# Patient Record
Sex: Male | Born: 1965 | Race: White | Hispanic: No | Marital: Married | Smoking: Never smoker
Health system: Southern US, Community
[De-identification: ages and names within clinical notes are randomized; demographics above are authoritative.]

## PROBLEM LIST (undated history)

## (undated) HISTORY — PX: MEDIAL COLLATERAL LIGAMENT AND LATERAL COLLATERAL LIGAMENT REPAIR, KNEE: SHX2017

## (undated) HISTORY — PX: NASAL SEPTUM SURGERY: SHX37

## (undated) HISTORY — PX: ANTERIOR CRUCIATE LIGAMENT REPAIR: SHX115

---

## 2000-03-11 ENCOUNTER — Emergency Department (HOSPITAL_COMMUNITY): Admission: EM | Admit: 2000-03-11 | Discharge: 2000-03-11 | Payer: Self-pay | Admitting: *Deleted

## 2003-09-04 ENCOUNTER — Emergency Department (HOSPITAL_COMMUNITY): Admission: AD | Admit: 2003-09-04 | Discharge: 2003-09-04 | Payer: Self-pay | Admitting: Family Medicine

## 2010-03-28 ENCOUNTER — Encounter: Admission: RE | Admit: 2010-03-28 | Discharge: 2010-03-28 | Payer: Self-pay | Admitting: Family Medicine

## 2013-05-17 ENCOUNTER — Ambulatory Visit: Payer: Managed Care, Other (non HMO) | Admitting: Physician Assistant

## 2013-05-17 VITALS — BP 111/75 | HR 81 | Temp 97.8°F | Resp 18 | Wt 206.0 lb

## 2013-05-17 DIAGNOSIS — J329 Chronic sinusitis, unspecified: Secondary | ICD-10-CM

## 2013-05-17 MED ORDER — IPRATROPIUM BROMIDE 0.03 % NA SOLN: 2.0000 | Freq: Two times a day (BID) | NASAL | Status: DC

## 2013-05-17 MED ORDER — AMOXICILLIN-POT CLAVULANATE 875-125 MG PO TABS: 1.0000 | ORAL_TABLET | Freq: Two times a day (BID) | ORAL | Status: DC

## 2013-05-17 NOTE — Progress Notes (Signed)
  Subjective:    Patient ID: Danny Kirby, male    DOB: [birth year], 47 y.o.   MRN: [id number]  HPI  [name] is here for sinus pressure for the past week that has been getting progressively worse. Patient points to b/l cheeks, jaws, and across foreheard as areas most tender. [name] has a history of grinding his teeth at night but this pain feels different than that pain from grinding his teeth. Pain is not worse with any particular movements or positions.   Denies SOB, cough, sore throat, postnasal drainage, nasal congestion, ear congestion, and ear pain. Denies fever, chills, or myalgias. He states he feels ok except for the pain in his face.  He has been taking ibuprofen to help with symptoms.  Review of Systems As above    Objective:    Filed Vitals:   05/17/13 0919  BP: 111/75  Pulse: 81  Temp: 97.8 F (36.6 C)  TempSrc: Oral  Resp: 18  Weight: 206 lb (93.441 kg)  SpO2: 99%    Physical Exam  Constitutional: Vital signs are normal. He appears well-developed and well-nourished. He is cooperative.  HENT:  Head: Normocephalic and atraumatic.  Right Ear: Tympanic membrane, external ear and ear canal normal.  Left Ear: Tympanic membrane, external ear and ear canal normal.  Nose: No mucosal edema, rhinorrhea or sinus tenderness. Right sinus exhibits no maxillary sinus tenderness and no frontal sinus tenderness. Left sinus exhibits no maxillary sinus tenderness and no frontal sinus tenderness.  Mouth/Throat: Mucous membranes are normal. No oropharyngeal exudate or posterior oropharyngeal erythema.  Cardiovascular: Normal rate, regular rhythm, S1 normal and S2 normal.   Pulmonary/Chest: Effort normal and breath sounds normal.  Lymphadenopathy:    He has no cervical adenopathy.  Neurological: He is alert.  Skin: Skin is warm and dry.          Assessment & Plan:  Sinusitis - Plan: ipratropium (ATROVENT) 0.03 % nasal spray, amoxicillin-clavulanate (AUGMENTIN) 875-125  MG per tablet  Drink plenty of fluid and rest. F/U is not improving significantly in the next 48 hours

## 2013-05-17 NOTE — Progress Notes (Signed)
I have examined this patient along with the student and agree.  

## 2013-05-17 NOTE — Patient Instructions (Addendum)
Drink plenty of fluids and rest. Take OTC mucinex to help thin mucus.

## 2013-12-17 ENCOUNTER — Ambulatory Visit: Payer: Managed Care, Other (non HMO) | Admitting: Family Medicine

## 2013-12-17 VITALS — BP 118/70 | HR 66 | Temp 97.5°F | Resp 18 | Ht 67.5 in | Wt 199.1 lb

## 2013-12-17 DIAGNOSIS — L02219 Cutaneous abscess of trunk, unspecified: Secondary | ICD-10-CM

## 2013-12-17 DIAGNOSIS — M79622 Pain in left upper arm: Secondary | ICD-10-CM

## 2013-12-17 DIAGNOSIS — M79609 Pain in unspecified limb: Secondary | ICD-10-CM

## 2013-12-17 DIAGNOSIS — L03319 Cellulitis of trunk, unspecified: Secondary | ICD-10-CM

## 2013-12-17 MED ORDER — DOXYCYCLINE HYCLATE 100 MG PO CAPS: 100.0000 mg | ORAL_CAPSULE | Freq: Two times a day (BID) | ORAL | Status: DC

## 2013-12-17 NOTE — Patient Instructions (Signed)
Take the antibiotic as prescribed. Apply a warm compress to the area for 15-20 minutes 2-4 times each day. Change the dressing if it becomes saturated, leaks or falls off.  

## 2013-12-17 NOTE — Progress Notes (Signed)
Subjective:    Patient ID: Danny BaumgartenKevin L Haack, male    DOB: 04/19/1966, 48 y.o.   MRN: [id number]  Arm Pain    Chief Complaint  Patient presents with  . Arm Pain    infection under left arm-noticed 3-4 days ago. Painful, & got it to drain some last night. Feels like another area is popping up too.    This chart was scribed for Ethelda ChickKristi M Smith, MD by Andrew Auaven Small, ED Scribe. This patient was seen in room 4 and the patient's care was started at 12:25 PM.  HPI Comments: Danny BaumgartenKevin L Pavek is a 48 y.o. male who presents to the [facility] complaining of 2 painful abscess under left arm onset 3-4 days. Pt reports that the second abscess began to rise 1 day ago. Pt report that he drained the first himself last night by squeezing the abscess in which he accumulated about half tablespoon of pus. Today he reports low grade fever of 99, diaphoresis and chills. Pt reports multiple similar abscesses of R axilla in the past in which he drained himself. He reports about 5 small abscesses in about 3-4 months ago.  Pt denies recent change in deodorant. Previous I&D of R inguinal region two years ago.  No past medical history on file. No Known Allergies Prior to Admission medications   Not on File   History   Social History  . Marital Status: Married    Spouse Name: N/A    Number of Children: N/A  . Years of Education: N/A   Occupational History  . Not on file.   Social History Main Topics  . Smoking status: Never Smoker   . Smokeless tobacco: Not on file  . Alcohol Use: No  . Drug Use: No  . Sexual Activity: Yes   Other Topics Concern  . Not on file   Social History Narrative   Lives at home with wife    Review of Systems  Constitutional: Positive for fever, chills and diaphoresis.  Skin: Positive for wound. Negative for rash.      Objective:   Physical Exam  Nursing note and vitals reviewed. Constitutional: He is oriented to person, place, and time. He appears  well-developed and well-nourished. No distress.  HENT:  Head: Normocephalic and atraumatic.  Eyes: EOM are normal.  Pulmonary/Chest: Effort normal.  Musculoskeletal: Normal range of motion.  Neurological: He is alert and oriented to person, place, and time.  Skin: Skin is warm and dry. He is not diaphoretic.  1 cm carbuncle/ pustule with induration, erythema at axilla L.  2.5 x 1.5  Indurated area with tenderness and mild fluctuants. 3 cm surrounding erythema inferior to the second indurated area.  Psychiatric: He has a normal mood and affect. His behavior is normal.   PROCEDURE NOTE: SEE SEPARATE PROCEDURE NOTE.    Assessment & Plan:  Pain in left axilla  Cellulitis and abscess of trunk - Plan: Wound culture  1. Pain L axilla:  New. Secondary to abscess/cellulitis.  Recommend Motrin or Tylenol PRN. 2.  L axilla abscess/cellulitis:  New.  Wound culture obtained with I&D; s/p I&D. Rx for Doxycycline provided.  Follow-up 48 hours for reevaluation.  RTC sooner for fever > 100.5, increasing pain, redness.  Meds ordered this encounter  Medications  . doxycycline (VIBRAMYCIN) 100 MG capsule    Sig: Take 1 capsule (100 mg total) by mouth 2 (two) times daily.    Dispense:  20 capsule  Refill:  0   I personally performed the services described in this documentation, which was scribed in my presence.  The recorded information has been reviewed and is accurate.  Nilda SimmerKristi Smith, M.D.  [facility]  [place] [address] [place], [place]  [address] [phone] phone [phone] fax

## 2013-12-17 NOTE — Progress Notes (Signed)
VCO.  Locally anesthetized at the site of active drainage with 2.5cc of 2% lidocaine plain.  Unable to lift eschar.  1cm incisions made with 11 blade. Significantly less purulence expressed than anticipated given size of lesion however pt expressed contents from lesion prior to visit.  Wound cavity explored with curved hematates showing communication between 2 areas of induration.  Sample collected for culture. Wound packed with 1/4 inch plain packing and dressed.

## 2013-12-19 ENCOUNTER — Ambulatory Visit (INDEPENDENT_AMBULATORY_CARE_PROVIDER_SITE_OTHER): Payer: Managed Care, Other (non HMO) | Admitting: Physician Assistant

## 2013-12-19 VITALS — BP 134/72 | HR 62 | Temp 98.1°F | Resp 16 | Ht 67.5 in | Wt 201.0 lb

## 2013-12-19 DIAGNOSIS — L03319 Cellulitis of trunk, unspecified: Principal | ICD-10-CM

## 2013-12-19 DIAGNOSIS — L02219 Cutaneous abscess of trunk, unspecified: Secondary | ICD-10-CM

## 2013-12-19 LAB — WOUND CULTURE
GRAM STAIN: NONE SEEN
Gram Stain: NONE SEEN
Gram Stain: NONE SEEN

## 2013-12-19 NOTE — Progress Notes (Signed)
I have examined this patient along with the student and agree.  Return in about 2 days (around 12/21/2013) for wound care, sooner if pain/swelling increase.

## 2013-12-19 NOTE — Patient Instructions (Signed)
Continue the antibiotic as prescribed. Apply a warm compress to the area for 15-20 minutes 2-4 times each day. Change the dressing if it becomes saturated, leaks or falls off.  

## 2013-12-19 NOTE — Progress Notes (Signed)
   Subjective:    Patient ID: Danny BaumgartenKevin L Gutterman, male    DOB: 08/08/1966, 48 y.o.   MRN: [id number]  HPI  48y.o male presents for wound check of L armpit abscess that was incised and drained on 4/26 office visit.  Pt noted a low grade fever for past 2 days.  He also states the area of induration that was not incised at last visit has been increasing in size and tenderness.  Pt still taking doxycycline as prescribed.  Review of Systems  Constitutional: Positive for fever.  HENT: Negative.   Eyes: Negative.   Respiratory: Negative for cough, shortness of breath and wheezing.   Cardiovascular: Negative.   Gastrointestinal: Negative for nausea, vomiting, abdominal pain and diarrhea.  Endocrine: Negative.   Genitourinary: Negative.   Musculoskeletal: Negative.   Skin: Negative.   Allergic/Immunologic: Negative.   Hematological: Negative.        Objective:   Physical Exam  Constitutional: He is oriented to person, place, and time. He appears well-developed and well-nourished. No distress.  BP 134/72  Pulse 62  Temp(Src) 98.1 F (36.7 C) (Oral)  Resp 16  Ht 5' 7.5" (1.715 m)  Wt 201 lb (91.173 kg)  BMI 31.00 kg/m2  SpO2 98%   Neurological: He is alert and oriented to person, place, and time.    Erythema of L axilla and lateral chest has all but resolved from previous visit.  However erythema surrounding surgical incision site extending 2-873mm.  Lesion anterior to incised abscess is noted to be larger than last visit.  Reluctant to incise this lesion due to the lack of fluctuance and presence of induration.  Packing was removed from incision site.  Superficial eschar material removed.  Cavity was irrigated with 4cc of 2% lidocaine plain.  Minimal exudate expressed from cavity.  Explored with blunt scissors.  Scissors were able to be advanced 4cm  through a communicating passage to cavity underneath the 2nd, more anterior lesion.  Substantial amount of exudate was able to be expressed from  incision site with manipulation of indurated lesion.  Repacked with 1/4" plain packing through communicating passage from original incision site.      Assessment & Plan:   1. Cellulitis and abscess of trunk Wound repacked.  Continue doxycycline. Will return on 4/30 for wound check.

## 2013-12-21 ENCOUNTER — Ambulatory Visit (INDEPENDENT_AMBULATORY_CARE_PROVIDER_SITE_OTHER): Payer: Managed Care, Other (non HMO) | Admitting: Physician Assistant

## 2013-12-21 VITALS — BP 132/84 | HR 63 | Temp 98.0°F | Resp 18 | Ht 68.0 in | Wt 198.6 lb

## 2013-12-21 DIAGNOSIS — L03319 Cellulitis of trunk, unspecified: Secondary | ICD-10-CM

## 2013-12-21 DIAGNOSIS — L02219 Cutaneous abscess of trunk, unspecified: Secondary | ICD-10-CM

## 2013-12-21 NOTE — Progress Notes (Signed)
Patient ID: Danny BaumgartenKevin L Kirby MRN: [id number], DOB: 10/19/1965 48 y.o. Date of Encounter: 12/21/2013, 2:02 PM  Chief Complaint: Wound care   See previous note  HPI: 48 y.o. y/o male presents for wound care s/p I&D on 12/17/13 Doing well No issues or complaints Afebrile/ no chills No nausea or vomiting Tolerating doxycycline.  Pain improving.  Daily dressing change Previous note reviewed  No past medical history on file.   Home Meds: Prior to Admission medications   Medication Sig Start Date End Date Taking? Authorizing Provider  doxycycline (VIBRAMYCIN) 100 MG capsule Take 1 capsule (100 mg total) by mouth 2 (two) times daily. 12/17/13  Yes Ethelda ChickKristi M Smith, MD    Allergies: No Known Allergies  ROS: Constitutional: Afebrile, no chills Cardiovascular: negative for chest pain or palpitations Dermatological: Positive for wound. Negative for erythema, pain, or warmth.  GI: No nausea or vomiting   EXAM: Physical Exam: Blood pressure 132/84, pulse 63, temperature 98 F (36.7 C), temperature source Oral, resp. rate 18, height 5\' 8"  (1.727 m), weight 198 lb 9.6 oz (90.084 kg), SpO2 98.00%., Body mass index is 30.2 kg/(m^2). General: Well developed, well nourished, in no acute distress. Nontoxic appearing. Head: Normocephalic, atraumatic, sclera non-icteric.  Neck: Supple. Lungs: Breathing is unlabored. Heart: Normal rate. Skin:  Warm and moist. Dressing and packing in place. No induration, erythema, or tenderness to palpation of wound. There is a medial area of induration and swelling without any overlying erythema or warmth. Neuro: Alert and oriented X 3. Moves all extremities spontaneously. Normal gait.  Psych:  Responds to questions appropriately with a normal affect.       PROCEDURE: Dressing and packing removed. Moderate amount of purulence expressed Wound bed healthy Irrigated with 1% plain lidocaine 5 cc. Repacked with 1/4 plain packing.  Dressing  applied  LAB: Culture: staph aureus  A/P: 48 y.o. y/o male with axillary cellulitis/abscess as above s/p I&D on 12/17/13 Wound care per above Continue doxycycline.  Pain well controlled Daily dressing changes Recheck 48 hours  Signed, Rhoderick MoodyHeather Alliya Marcon, PA-C 12/21/2013 2:02 PM

## 2013-12-23 ENCOUNTER — Ambulatory Visit (INDEPENDENT_AMBULATORY_CARE_PROVIDER_SITE_OTHER): Payer: Managed Care, Other (non HMO) | Admitting: Physician Assistant

## 2013-12-23 VITALS — BP 124/60 | HR 63 | Temp 98.0°F | Resp 16 | Ht 68.0 in | Wt 198.0 lb

## 2013-12-23 DIAGNOSIS — L03112 Cellulitis of left axilla: Secondary | ICD-10-CM

## 2013-12-23 DIAGNOSIS — M79622 Pain in left upper arm: Secondary | ICD-10-CM

## 2013-12-23 DIAGNOSIS — M79609 Pain in unspecified limb: Secondary | ICD-10-CM

## 2013-12-23 DIAGNOSIS — IMO0002 Reserved for concepts with insufficient information to code with codable children: Secondary | ICD-10-CM

## 2013-12-23 NOTE — Progress Notes (Signed)
   Subjective:    Patient ID: Danny BaumgartenKevin L Musselman, male    DOB: 01/18/1966, 48 y.o.   MRN: [id number]  HPI Pt presents to clinic for a wound recheck.  The area is better but still slightly draining.  He is tolerating the abx ok.  Much less painful.  Changing drsg daily.  Review of Systems  Constitutional: Negative for fever and chills.  Skin: Positive for wound.       Objective:   Physical Exam  Vitals reviewed. Constitutional: He appears well-developed and well-nourished.  HENT:  Head: Normocephalic and atraumatic.  Right Ear: External ear normal.  Left Ear: External ear normal.  Pulmonary/Chest: Effort normal.  Skin: Skin is warm and dry.  Drsg and packing removed from wound - no purulence expressed - minimal induration at the medial aspect of the wound and almost no erythema present around the wound.  Slight TTP along indurated area.  Incision is about the width of the depth of the wound.  Psychiatric: He has a normal mood and affect. His behavior is normal. Judgment and thought content normal.       Assessment & Plan:  Pain in left axilla  Cellulitis of axilla, left  No packing needed.  Finish abx.  Continue drsg changes until wound healed over.   Benny LennertSarah Valen Gillison PA-C  [facility] 12/23/2013 2:48 PM

## 2014-11-11 ENCOUNTER — Ambulatory Visit (INDEPENDENT_AMBULATORY_CARE_PROVIDER_SITE_OTHER): Payer: Managed Care, Other (non HMO) | Admitting: Emergency Medicine

## 2014-11-11 VITALS — BP 114/62 | HR 73 | Temp 97.4°F | Resp 20 | Ht 68.0 in | Wt 194.4 lb

## 2014-11-11 DIAGNOSIS — H04302 Unspecified dacryocystitis of left lacrimal passage: Secondary | ICD-10-CM

## 2014-11-11 MED ORDER — POLYMYXIN B-TRIMETHOPRIM 10000-0.1 UNIT/ML-% OP SOLN: 2.0000 [drp] | OPHTHALMIC | Status: DC

## 2014-11-11 NOTE — Progress Notes (Signed)
[facility] [address], [place] [id number] [phone]  Date:  11/11/2014   Name:  Danny Kirby   DOB:  04/10/1966   MRN:  [id number]  PCP:  No PCP Per Patient    Chief Complaint: Eye Pain   History of Present Illness:  Danny BaumgartenKevin L Kirby is a 49 y.o. very pleasant male patient who presents with the following:  No history of injury.  Mowed grass on Thursday. After developed pain and now swelling in the lower lid No visual symptoms. No gluing or exudate No foreign body sensation;  There are no active problems to display for this patient.   History reviewed. No pertinent past medical history.  Past Surgical History  Procedure Laterality Date  . Anterior cruciate ligament repair    . Nasal septum surgery    . Medial collateral ligament and lateral collateral ligament repair, knee      History  Substance Use Topics  . Smoking status: Never Smoker   . Smokeless tobacco: Not on file  . Alcohol Use: No    History reviewed. No pertinent family history.  No Known Allergies  Medication list has been reviewed and updated.  No current outpatient prescriptions on file prior to visit.   No current facility-administered medications on file prior to visit.    Review of Systems:  As per HPI, otherwise negative.    Physical Examination: Filed Vitals:   11/11/14 1022  BP: 114/62  Pulse: 73  Temp: 97.4 F (36.3 C)  Resp: 20   Filed Vitals:   11/11/14 1022  Height: 5\' 8"  (1.727 m)  Weight: 194 lb 6 oz (88.168 kg)   Body mass index is 29.56 kg/(m^2). Ideal Body Weight: Weight in (lb) to have BMI = 25: 164.1   GEN: WDWN, NAD, Non-toxic, Alert & Oriented x 3 HEENT: Atraumatic, Normocephalic.  Ears and Nose: No external deformity. EXTR: No clubbing/cyanosis/edema NEURO: Normal gait.  PSYCH: Normally interactive. Conversant. Not depressed or anxious appearing.  Calm demeanor.  LEFT eye no fb. No injection or hyphema.  Lower lid medially  swollen and erythematous.  No lacrimal duct drainage  Assessment and Plan: Dacryocystitis Trimethoprim  Signed,  Phillips OdorJeffery Akima Slaugh, MD

## 2014-11-11 NOTE — Patient Instructions (Signed)
Dacryocystitis  Dacryocystitis is an infection of the tear sac (lacrimal sac). The lacrimal sac lies between the inner corner of the eyelids and the nose. The glands of the eyelids produce tears. This is to keep the surface of the eye wet and protect it. These tears drain from the surface of the eyes through a duct in each lid (lacrimal ducts), then through the lacrimal sac into the nose. The tears are then swallowed. If the lacrimal sacs become blocked, bacteria begin to buildup. The lacrimal sacs can become infected. Dacryocystitis may be sudden (acute) or long-lasting (chronic). This problem is most common in infants because the tear ducts are not fully developed and clog easily. In that case, infants may have episodes of tearing and infection. However, in most cases, the problem gets better as the infant grows. CAUSES  The cause is often unknown. Known causes can include:  Malformation of the lacrimal sac.  Injury to the eye.  Eye infection.  Injury or inflammation of the nasal passages. SYMPTOMS   Usually only one eye is involved.  Excessive tearing and watering from the involved eye.  Tenderness, redness, and swelling of the lower lid near the nose.  A sore, red, inflamed bump on the inner corner of the lower lid. DIAGNOSIS  A diagnosis is made after an eye exam to see how much blockage is present and if the surface of the eye is also infected. A culture of the fluid from the lacrimal sac may be examined to find if a specific infection is present. TREATMENT  Treatment depends on:   The person's age.  Whether or not the infection is chronic or acute.  The amount of blockage that is present. Additional treatment Sometimes massaging the area (starting from the inside of the eye and gently massaging down toward the nose) will improve the condition, combined with antibiotic eyedrops or ointments. If massaging the area does not work, it may be necessary to probe the ducts and open up  the drainage system. While this is easily done in the office in adults, probing usually has to be done under general anesthesia in infants.  If the blockage cannot be cleared by probing, surgery may be needed under general anesthesia to create a direct opening for tears to flow between the lacrimal sac and the inside of the nose (dacryocystorhinostomy, DCR). HOME CARE INSTRUCTIONS   Use any antibiotic eyedrops, ointment, or pills as directed by the caregiver. Finish all medicines even if the symptoms start to get better.  Massage the lacrimal sac as directed by the caregiver. SEEK IMMEDIATE MEDICAL CARE IF:   There is increased pain, swelling, redness, or drainage from the eye.  Muscle aches, chills, or a general sick feeling develop.  A fever or persistent symptoms develop for more than 2-3 days.  The fever and symptoms suddenly get worse. MAKE SURE YOU:   Understand these instructions.  Will watch your condition.  Will get help right away if you are not doing well or get worse. Document Released: 08/07/2000 Document Revised: 12/25/2013 Document Reviewed: 01/11/2012 Exit[employer] Patient Information 2015 [employer]. This information is not intended to replace advice given to you by your health care provider. Make sure you discuss any questions you have with your health care provider.  

## 2016-06-11 ENCOUNTER — Ambulatory Visit (INDEPENDENT_AMBULATORY_CARE_PROVIDER_SITE_OTHER): Payer: Managed Care, Other (non HMO) | Admitting: Physician Assistant

## 2016-06-11 VITALS — BP 114/70 | HR 63 | Temp 98.1°F | Resp 17 | Ht 68.0 in | Wt 196.0 lb

## 2016-06-11 DIAGNOSIS — L02419 Cutaneous abscess of limb, unspecified: Secondary | ICD-10-CM | POA: Diagnosis not present

## 2016-06-11 MED ORDER — DOXYCYCLINE HYCLATE 100 MG PO CAPS: 100.0000 mg | ORAL_CAPSULE | Freq: Two times a day (BID) | ORAL | 0 refills | Status: AC

## 2016-06-11 NOTE — Progress Notes (Signed)
   Danny GenreKevin L Kirby  MRN: [id number] DOB: 10/30/1965  PCP: No PCP Per Patient  Subjective:  Pt is a [age] yea rold male who presents to clinic for a mass on his right armpit x two days. He notes it "feels full" and is sore. He squeezed it last night and expressed "a little bit of puss". Did not see a white head on it. States it is mostly just red.  Denies fever, chills, nausea, vomiting, diarrhea.   History of abscesses in groin and armpit. Had abscess in his right armpit drained last year. This provided him relief and he would like this to be drained today.   Review of Systems  Constitutional: Negative for chills, diaphoresis, fatigue and fever.  Respiratory: Negative for cough, chest tightness and shortness of breath.   Cardiovascular: Negative for chest pain and palpitations.  Skin: Positive for color change and wound (right armpit). Negative for rash.  Neurological: Negative for weakness, light-headedness, numbness and headaches.    There are no active problems to display for this patient.   No current outpatient prescriptions on file prior to visit.   No current facility-administered medications on file prior to visit.     No Known Allergies  Objective:  BP 114/70 (BP Location: Right Arm, Patient Position: Sitting, Cuff Size: Large)   Pulse 63   Temp 98.1 F (36.7 C) (Oral)   Resp 17   Ht 5\' 8"  (1.727 m)   Wt 196 lb (88.9 kg)   SpO2 100%   BMI 29.80 kg/m   Physical Exam  Constitutional: He is oriented to person, place, and time and well-developed, well-nourished, and in no distress. No distress.  Cardiovascular: Normal rate, regular rhythm and normal heart sounds.   Pulmonary/Chest: Effort normal. No respiratory distress.  Neurological: He is alert and oriented to person, place, and time. GCS score is 15.  Skin: Skin is warm and dry.  5.5x3 cm firm induration of right axilla. Erythema noted in the center of induration. No fluctuance appreciated. No drainage present. TTP.    Psychiatric: Mood, memory, affect and judgment normal.  Vitals reviewed.   Assessment and Plan :  1. Axillary abscess - doxycycline (VIBRAMYCIN) 100 MG capsule; Take 1 capsule (100 mg total) by mouth 2 (two) times daily.  Dispense: 14 capsule; Refill: 0 - Supportive care: Use warm compressed and antibiotics. If no improvement/worsening symptoms, RTC for possible drainage. He understands and agrees with plan    Marco CollieWhitney Keighley Deckman, PA-C  [facility] 06/11/2016 11:10 AM

## 2016-06-11 NOTE — Patient Instructions (Addendum)
Please use warm compress 3-4 times a day on the affected area. Take antibiotics as prescribed. Please return to clinic in 48-72 hours if you are seeing no improvement.    Thank you for coming in today. I hope you feel we met your needs.  Feel free to call [facility] if you have any questions or further requests.  Please consider signing up for MyChart if you do not already have it, as this is a great way to communicate with me.  Best,  Whitney McVey, PA-C  IF you received an x-ray today, you will receive an invoice from [facility]. Please contact [facility] at [phone] with questions or concerns regarding your invoice.   IF you received labwork today, you will receive an invoice from [employer]. Please contact [facility] at [phone] with questions or concerns regarding your invoice.   Our billing staff will not be able to assist you with questions regarding bills from these companies.  You will be contacted with the lab results as soon as they are available. The fastest way to get your results is to activate your My Chart account. Instructions are located on the last page of this paperwork. If you have not heard from [place] regarding the results in 2 weeks, please contact this office.      Abscess An abscess is an infected area that contains a collection of pus and debris.It can occur in almost any part of the body. An abscess is also known as a furuncle or boil. CAUSES  An abscess occurs when tissue gets infected. This can occur from blockage of oil or sweat glands, infection of hair follicles, or a minor injury to the skin. As the body tries to fight the infection, pus collects in the area and creates pressure under the skin. This pressure causes pain. People with weakened immune systems have difficulty fighting infections and get certain abscesses more often.  SYMPTOMS Usually an abscess develops on the skin and becomes a painful mass that is  red, warm, and tender. If the abscess forms under the skin, you may feel a moveable soft area under the skin. Some abscesses break open (rupture) on their own, but most will continue to get worse without care. The infection can spread deeper into the body and eventually into the bloodstream, causing you to feel ill.  DIAGNOSIS  Your caregiver will take your medical history and perform a physical exam. A sample of fluid may also be taken from the abscess to determine what is causing your infection. TREATMENT  Your caregiver may prescribe antibiotic medicines to fight the infection. However, taking antibiotics alone usually does not cure an abscess. Your caregiver may need to make a small cut (incision) in the abscess to drain the pus. In some cases, gauze is packed into the abscess to reduce pain and to continue draining the area. HOME CARE INSTRUCTIONS   Only take over-the-counter or prescription medicines for pain, discomfort, or fever as directed by your caregiver.  If you were prescribed antibiotics, take them as directed. Finish them even if you start to feel better.  If gauze is used, follow your caregiver's directions for changing the gauze.  To avoid spreading the infection:  Keep your draining abscess covered with a bandage.  Wash your hands well.  Do not share personal care items, towels, or whirlpools with others.  Avoid skin contact with others.  Keep your skin and clothes clean around the abscess.  Keep all follow-up appointments as directed  by your caregiver. SEEK MEDICAL CARE IF:   You have increased pain, swelling, redness, fluid drainage, or bleeding.  You have muscle aches, chills, or a general ill feeling.  You have a fever. MAKE SURE YOU:   Understand these instructions.  Will watch your condition.  Will get help right away if you are not doing well or get worse.   This information is not intended to replace advice given to you by your health care provider.  Make sure you discuss any questions you have with your health care provider.   Document Released: 05/20/2005 Document Revised: 02/09/2012 Document Reviewed: 10/23/2011 Elsevier Interactive Patient Education [employer].

## 2017-08-20 ENCOUNTER — Encounter: Payer: Self-pay | Admitting: Sports Medicine

## 2017-08-20 ENCOUNTER — Ambulatory Visit (INDEPENDENT_AMBULATORY_CARE_PROVIDER_SITE_OTHER): Payer: Managed Care, Other (non HMO) | Admitting: Sports Medicine

## 2017-08-20 ENCOUNTER — Ambulatory Visit (INDEPENDENT_AMBULATORY_CARE_PROVIDER_SITE_OTHER): Payer: Managed Care, Other (non HMO)

## 2017-08-20 VITALS — BP 122/82 | HR 64 | Ht 68.0 in | Wt 208.4 lb

## 2017-08-20 DIAGNOSIS — M542 Cervicalgia: Secondary | ICD-10-CM

## 2017-08-20 MED ORDER — METHYLPREDNISOLONE 4 MG PO TBPK: ORAL_TABLET | ORAL | 0 refills | Status: DC

## 2017-08-20 MED ORDER — GABAPENTIN 400 MG PO CAPS: 400.0000 mg | ORAL_CAPSULE | Freq: Three times a day (TID) | ORAL | 1 refills | Status: DC

## 2017-08-20 NOTE — Patient Instructions (Addendum)
I believe that you do have a herniated/bulging disc in your neck.  Try taking the medications we prescribed.  If you have any issues please call let us know.  Avoid any type of significant straining or heavy lifting.  It is okay to perform gentle aerobic exercise but you should try to avoid riding your bike and any exercises that have you in a slightly bent over position.  We can discuss referral to physical therapy in 2 weeks and see you back

## 2017-08-20 NOTE — Assessment & Plan Note (Signed)
Symptomatic treatment with steroids and gabapentin, follow-up in 2 weeks for consideration of reevaluation and consideration of referral to PT.

## 2017-08-20 NOTE — Progress Notes (Signed)
Veverly FellsMichael D. Delorise Shinerigby, DO  [facility] [phone]  Danny GenreKevin L Kirby - 51 y.o. male MRN [id number]  Date of birth: 09/03/1965  Visit Date: 08/20/2017  PCP: Patient, No Pcp Per   Referred by: No ref. provider found   Scribe for today's visit: Danny Kirby, CMA    SUBJECTIVE:  Danny GenreKevin L Soth is here for New Patient (Initial Visit) (RT arm pain/tingling)  His neck pain and RT arm pain/tingling symptoms INITIALLY: Began a few months ago but tingling in RT arm started Christmas Eve. MOI is unknown.  Described as moderate-severe aching/shooting pain, radiating to the 1st finger and at times 2nd finger.  Worsened with looking up. He has a hard time trying to get comfortable while lying down d/t neck pain.  Improved with sitting still.  Additional associated symptoms include: Pt denies lower back pain. Does noticed pain around RT scapula. He denies recent HA or visual change. Minimal pain when turning head side-to-side.     At this time symptoms are worsening compared to onset, tingling sensation in RT arm is new since onset.  He has been taking Ibuprofen, Acetaminophen, and Aleve with minimal relief. He has tried using heat with short term relief and ice with no relief.    ROS Reports night time disturbances. Denies fevers, chills, or night sweats. Denies unexplained weight loss. Denies personal history of cancer. Denies changes in bowel or bladder habits. Denies recent unreported falls. Denies new or worsening dyspnea or wheezing. Denies headaches or dizziness.  Reports numbness, tingling or weakness  In the extremities.  Denies dizziness or presyncopal episodes Denies lower extremity edema     HISTORY & PERTINENT PRIOR DATA:  Prior History reviewed and updated per electronic medical record.  Significant history, findings, studies and interim changes include:  reports that  has never smoked. he has never used smokeless tobacco. No  results for input(s): HGBA1C, LABURIC, CREATINE in the last 8760 hours. No specialty comments available. Problem  Neck Pain   08/20/2017: X-rays are overall reassuring, suspect C6 radiculitis      OBJECTIVE:  VS:  HT:5\' 8"  (172.7 cm)   WT:208 lb 6.4 oz (94.5 kg)  BMI:31.69    BP:122/82  HR:64bpm  TEMP: ( )  RESP:95 %  PHYSICAL EXAM: Constitutional: WDWN, Non-toxic appearing. Psychiatric: Alert & appropriately interactive. Not depressed or anxious appearing. Respiratory: No increased work of breathing. Trachea Midline Eyes: Pupils are equal. EOM intact without nystagmus. No scleral icterus Cardiovascular:  Peripheral Pulses: peripheral pulses symmetrical No clubbing or cyanosis appreciated Capillary Refill is normal, less than 2 seconds No signficant generalized edema/anasarca Sensory Exam: deficit noted -C6 distribution dysesthesia  SHOULDER Findings: No stiffness, no weakness, no crepitus noted  Neck:   Well aligned, no significant torticollis  Midline Bony TTP: none   Paraspinal Muscle Spasm: No  CERVICAL ROM: normal range of motion only pain with cervical extension with slight limitations.  Remarkably normal side bending and rotation bilaterally  NEURAL TENSION SIGNS Right Left  Brachial Plexus Squeeze: positive, severe pain normal, no pain  Arm Squeeze Test: positive, severe pain normal, no pain  Spurling's Compression Test:    Lhermitte's Compression test: Negative, no radiating pain   REFLEXES Right Left  DTR - C5 -Biceps  trace trace  DTR - C6 - Brachiorad trace trace  DTR - C7 - Triceps trace trace   UMN - Hoffman's Negative/Normal Negative/Normal  UMN - Pectoral     MOTOR TESTING: Intact  in all UE myotomes.  Slight discrepancy with grip strength from right to left this is mild.   ASSESSMENT & PLAN:   1. Neck pain    PLAN: Overall reassuring exam with no focal weakness.  He is diffusely hyporeflexic but symmetric.  Follow-up in 2 weeks to ensure  clinical improvement and if any persistent significant symptoms can consider referral to physical therapy.  If any worsening symptoms concerning for can consider further diagnostic evaluation with MRI but this will hopefully not be needed.  Neck pain Symptomatic treatment with steroids and gabapentin, follow-up in 2 weeks for consideration of reevaluation and consideration of referral to PT.   ++++++++++++++++++++++++++++++++++++++++++++ Orders & Meds: Orders Placed This Encounter  Procedures  . DG Cervical Spine Complete    Meds ordered this encounter  Medications  . methylPREDNISolone (MEDROL DOSEPAK) 4 MG TBPK tablet    Sig: Take by mouth as directed. Take 6 tablets on the first day prescribed then as directed.    Dispense:  21 tablet    Refill:  0  . gabapentin (NEURONTIN) 400 MG capsule    Sig: Take 1 capsule (400 mg total) by mouth 3 (three) times daily.    Dispense:  90 capsule    Refill:  1    ++++++++++++++++++++++++++++++++++++++++++++ Follow-up: Return in about 2 weeks (around 09/03/2017).   Pertinent documentation may be included in additional procedure notes, imaging studies, problem based documentation and patient instructions. Please see these sections of the encounter for additional information regarding this visit. CMA/ATC served as Neurosurgeonscribe during this visit. History, Physical, and Plan performed by medical provider. Documentation and orders reviewed and attested to.      Andrena MewsMichael D Junaid Wurzer, DO    [facility]

## 2017-09-03 ENCOUNTER — Encounter: Payer: Self-pay | Admitting: Sports Medicine

## 2017-09-03 ENCOUNTER — Ambulatory Visit (INDEPENDENT_AMBULATORY_CARE_PROVIDER_SITE_OTHER): Payer: Managed Care, Other (non HMO) | Admitting: Sports Medicine

## 2017-09-03 VITALS — BP 128/86 | HR 61 | Ht 68.0 in | Wt 207.0 lb

## 2017-09-03 DIAGNOSIS — M542 Cervicalgia: Secondary | ICD-10-CM

## 2017-09-03 NOTE — Patient Instructions (Signed)

## 2017-09-03 NOTE — Assessment & Plan Note (Signed)
Fairly significant weakness in the right wrist extensors and pain radiating through C6 distribution.  Marked neural tension signs today and persistent ongoing weakness in spite of conservative measures including gabapentin and Medrol Dosepak.  MRI indicated at this time with need for potential neurosurgical referral versus epidural steroid injection.

## 2017-09-03 NOTE — Progress Notes (Addendum)
Danny Kirby. [facility] at [facility] [phone]  Danny Kirby - 52 y.o. male MRN [id number]  Date of birth: Jun 12, 1966  Visit Date: 09/03/2017  PCP: Patient, No Pcp Per   Referred by: No ref. provider found   Scribe for today's visit: Stevenson Clinch, CMA     SUBJECTIVE:  Danny Kirby is here for Follow-up (neck and RT arm pain)  Compared to the last office visit, his previously described symptoms show no change  Current symptoms are moderate & radiate from neck into RT shoulder, arm, and into finger. Pt reports "it feels like the bone is coming apart, someone is drilling little holes into it". Pt also reports increased weakness in the RT arm, trouble holding razor to shave.  He was prescribed Medrol Dosepak and Gabapentin 400 mg 08/20/17. He reports that he did sleep without disturbance for 3 nights after started Gabapentin but now he is waking up d/t pain again. He has been taking Ibuprofen or Naproxen with minimal relief.   ROS Reports night time disturbances. Denies fevers, chills, or night sweats. Denies unexplained weight loss. Denies personal history of cancer. Denies changes in bowel or bladder habits. Denies recent unreported falls. Denies new or worsening dyspnea or wheezing. Denies headaches or dizziness.  Reports numbness in RT hand and thumb. Also reports weakness in RT arm.  Denies dizziness or presyncopal episodes Denies lower extremity edema     HISTORY & PERTINENT PRIOR DATA:  Prior History reviewed and updated per electronic medical record.  Significant history, findings, studies and interim changes include:  reports that  has never smoked. he has never used smokeless tobacco. No results for input(s): HGBA1C, LABURIC, CREATINE in the last 8760 hours. No specialty comments available. Problem  Neck Pain   08/20/2017: X-rays are overall reassuring, suspect C6 radiculitis     OBJECTIVE:  VS:  HT:5\' 8"   (172.7 cm)   WT:207 lb (93.9 kg)  BMI:31.48    BP:128/86  HR:61bpm  TEMP: ( )  RESP:95 %   PHYSICAL EXAM: Constitutional: WDWN, Non-toxic appearing. Psychiatric: Alert & appropriately interactive. Not depressed or anxious appearing. Respiratory: No increased work of breathing. Trachea Midline Eyes: Pupils are equal. EOM intact without nystagmus. No scleral icterus Cardiovascular:  Peripheral Pulses: peripheral pulses symmetrical No clubbing or cyanosis appreciated Capillary Refill is normal, less than 2 seconds No signficant generalized edema/anasarca Sensory Exam: intact to light touch   SHOULDER Findings: No stiffness, no weakness, no crepitus noted  Neck:   Well aligned, no significant torticollis  Midline Bony TTP: none   Paraspinal Muscle Spasm: No  CERVICAL ROM: Limited side bending to the right.  NEURAL TENSION SIGNS Right Left  Brachial Plexus Squeeze: positive, severe pain normal, no pain  Arm Squeeze Test: positive, severe pain normal, no pain  Spurling's Compression Test: positive, severe pain normal, no pain  Lhermitte's Compression test: Negative, no radiating pain   REFLEXES Right Left  DTR - C5 -Biceps  1+ 1+  DTR - C6 - Brachiorad 1+ 1+  DTR - C7 - Triceps 1+ 1+   UMN - Hoffman's Negative/Normal Negative/Normal  UMN - Pectoral     MOTOR TESTING: Weakness with wrist extension on the right, otherwise upper extremity myotomes intact.  No additional findings.   ASSESSMENT & PLAN:   1. Neck pain    PLAN:    Neck pain Fairly significant weakness in the right wrist extensors and pain radiating through C6 distribution.  Marked neural tension signs today and persistent ongoing weakness in spite of conservative measures including gabapentin and Medrol Dosepak.  MRI indicated at this time with need for potential neurosurgical referral versus epidural steroid injection.    ++++++++++++++++++++++++++++++++++++++++++++ Orders & Meds: Orders Placed This  Encounter  Procedures  . MR Cervical Spine Wo Contrast    No orders of the defined types were placed in this encounter.   ++++++++++++++++++++++++++++++++++++++++++++ Follow-up: No Follow-up on file.   Pertinent documentation may be included in additional procedure notes, imaging studies, problem based documentation and patient instructions. Please see these sections of the encounter for additional information regarding this visit. CMA/ATC served as Neurosurgeonscribe during this visit. History, Physical, and Plan performed by medical provider. Documentation and orders reviewed and attested to.      Andrena MewsMichael D Rigby, DO    [facility]

## 2017-09-06 ENCOUNTER — Ambulatory Visit: Payer: Managed Care, Other (non HMO) | Admitting: Sports Medicine

## 2017-09-14 ENCOUNTER — Telehealth: Payer: Self-pay | Admitting: Sports Medicine

## 2017-09-14 NOTE — Telephone Encounter (Signed)
No further action needed. Facility updated and order sent again.

## 2017-09-14 NOTE — Telephone Encounter (Signed)
Please make changes and advise.   Copied from CRM [id number]. Topic: Referral - Request >> Sep 14, 2017  9:58 AM Oneal GroutSebastian, Jennifer S wrote: Reason for CRM: [facility], does not have drs signature. Stating [facility] is not for their facility. Needs to be changed to [facility] [place], NPI [id number]. Fax # [phone]

## 2017-09-14 NOTE — Telephone Encounter (Signed)
Noted  

## 2017-09-15 ENCOUNTER — Encounter: Payer: Self-pay | Admitting: Sports Medicine

## 2017-09-20 ENCOUNTER — Other Ambulatory Visit: Payer: Self-pay | Admitting: Physical Therapy

## 2017-09-20 ENCOUNTER — Telehealth: Payer: Self-pay | Admitting: Sports Medicine

## 2017-09-20 DIAGNOSIS — M542 Cervicalgia: Secondary | ICD-10-CM

## 2017-09-20 NOTE — Telephone Encounter (Signed)
Called pt and informed him of options of seeing Dr. Alvester MorinNewton or following-up w/ Dr. Berline Choughigby first to then see Dr. Alvester MorinNewton.  Pt asked to be referred to Dr. Alvester MorinNewton and that referral was placed.

## 2017-09-20 NOTE — Telephone Encounter (Signed)
Called pt and left VM to call the office.  

## 2017-09-20 NOTE — Telephone Encounter (Signed)
Per Dr. Berline Choughigby, refer to Dr. Alvester MorinNewton for epidural injection.

## 2017-09-20 NOTE — Telephone Encounter (Signed)
Please advise 

## 2017-09-20 NOTE — Telephone Encounter (Signed)
Please call patient back to advise.

## 2017-09-20 NOTE — Telephone Encounter (Signed)
Copied from CRM [id number]. Topic: Quick Communication - Lab Results >> Sep 20, 2017  1:37 PM Cecelia ByarsGreen, Aeson Sawyers L, RMA wrote: Patient is requesting results from MRI

## 2017-10-06 ENCOUNTER — Ambulatory Visit (INDEPENDENT_AMBULATORY_CARE_PROVIDER_SITE_OTHER): Payer: Managed Care, Other (non HMO) | Admitting: Physical Medicine and Rehabilitation

## 2017-10-06 ENCOUNTER — Telehealth (INDEPENDENT_AMBULATORY_CARE_PROVIDER_SITE_OTHER): Payer: Self-pay | Admitting: Physical Medicine and Rehabilitation

## 2017-10-06 ENCOUNTER — Encounter (INDEPENDENT_AMBULATORY_CARE_PROVIDER_SITE_OTHER): Payer: Self-pay | Admitting: Physical Medicine and Rehabilitation

## 2017-10-06 VITALS — BP 130/81 | HR 68 | Temp 97.8°F

## 2017-10-06 DIAGNOSIS — M5412 Radiculopathy, cervical region: Secondary | ICD-10-CM

## 2017-10-06 DIAGNOSIS — M5010 Cervical disc disorder with radiculopathy, unspecified cervical region: Secondary | ICD-10-CM | POA: Diagnosis not present

## 2017-10-06 DIAGNOSIS — M4802 Spinal stenosis, cervical region: Secondary | ICD-10-CM

## 2017-10-06 MED ORDER — DIAZEPAM 5 MG PO TABS: ORAL_TABLET | ORAL | 0 refills | Status: DC

## 2017-10-06 NOTE — Progress Notes (Deleted)
Pt states an intermittent pain in the neck that radiates through the right arm. Pt states pain has been there since Christmas Eve 2018. Pt states reaching for things makes the pain worse, heating pad makes pain better. Pt [facility]. Right thumb numb. 400mg  gabapentin

## 2017-10-07 NOTE — Progress Notes (Signed)
Danny Kirby - 52 y.o. male MRN [id number]  Date of birth: Jul 09, 1966  Office Visit Note: Visit Date: 10/06/2017 PCP: Patient, No Pcp Per Referred by: Andrena Mews, DO  Subjective: Chief Complaint  Patient presents with  . Neck - Pain  . Right Shoulder - Pain  . Right Arm - Pain, Numbness  . Right Hand - Pain, Numbness    Thumb   HPI: Danny Kirby is a 52 year old right-hand-dominant gentleman who comes in today at the request of Dr. Gaspar Bidding for evaluation and management and possible intervention for right neck shoulder and arm pain with paresthesia.  He reports intermittent pain in the neck but fairly significant pain in the right shoulder that radiates anteriorly and laterally down into the thumb on the right hand.  He reports constant numbness of the right thumb.  He says this started all on Christmas Eve 2018.  He did see Dr. Berline Chough for the first time on December 28.  He reports that after taking gabapentin 400 mg 3 times a day which he does take now that he has been able to get a little bit more sleep but he still wakes up with pain at times.  If he does not note any left-sided symptoms.  He has had no specific trauma or injury.  He said no prior cervical spine surgery.  He has had off-and-on neck pain but nothing like this.  He reports worsening pain with reaching and turning of his head.  He does feel a weakness feeling in the arm and hand.  He does continue to try to lift weights he does exercise.  He does take anti-inflammatories to a degree but does not feel like they help very much.  Pad as well.  He said no associated headaches or bowel or bladder dysfunction.  He is otherwise fairly healthy without any real chronic medical problems.  He did have a course of prednisone which helped a little bit but the symptoms returned.  He did have an issue with neck pain prior to this and did have an MRI performed in 2011.  That MRI was performed by Eula Listen who works at [employer].  We unfortunately do not have their notes to review.  Dr. Berline Chough did obtain a new MRI given the radicular findings and this is reviewed with the patient today and reviewed below the notes.    Review of Systems  Constitutional: Negative for chills, fever, malaise/fatigue and weight loss.  HENT: Negative for hearing loss and sinus pain.   Eyes: Negative for blurred vision, double vision and photophobia.  Respiratory: Negative for cough and shortness of breath.   Cardiovascular: Negative for chest pain, palpitations and leg swelling.  Gastrointestinal: Negative for abdominal pain, nausea and vomiting.  Genitourinary: Negative for flank pain.  Musculoskeletal: Positive for neck pain. Negative for myalgias.       Radicular right arm pain with paresthesias and numbness in the thumb  Skin: Negative for itching and rash.  Neurological: Positive for tingling and weakness. Negative for tremors and focal weakness.  Endo/Heme/Allergies: Negative.   Psychiatric/Behavioral: Negative for depression.  All other systems reviewed and are negative.  Otherwise per HPI.  Assessment & Plan: Visit Diagnoses:  1. Cervical radiculopathy   2. Spinal stenosis of cervical region   3. Cervical disc disorder with radiculopathy     Plan: Findings:  History of prior fall many years ago who where he did land on his posterior neck and had prior  MRI in 2011.  Patient now reports pretty classic C6 radicular paresthesia on the right with MRI findings showing C5-6 central disc protrusion with narrowing of the canal anteriorly touching but not deforming the spinal cord.  There is basically moderate central stenosis at C5-6.  At C6-7 there is also central disc protrusion without as much stenosis.  I feel like there is a small grade 1 listhesis at this level.  This could correspond obviously to his see 6 radicular pain with more of the problem at C5-6.  We discussed at length treatment options.  He is currently on  gabapentin which is a good choice.  I do think it would be warranted to complete diagnostic and hopefully therapeutic C7-T1 interlaminar epidural steroid injection.  Would give him some Valium preprocedure anxiety.  He does not have any signs on physical exam that are worrisome.  We did talk at length about cervical surgery.  Given his age of 52 and the frank numbness in the thumb as well as the moderate spinal stenosis at C5-6 I think at some point he may have to consider decompression and fusion at this level.  Obviously he is at risk for something like a central cord syndrome with minor trauma.  He can continue to exercise but we talked about activity modification with the exercises and lifting.  I have asked him to follow with Dr. Berline Choughigby as well for recommendations in terms of exercise.    Meds & Orders:  Meds ordered this encounter  Medications  . diazepam (VALIUM) 5 MG tablet    Sig: Take 1 by mouth 1 to 2 hours pre-procedure. May repeat if necessary.    Dispense:  2 tablet    Refill:  0   No orders of the defined types were placed in this encounter.   Follow-up: Return for Right C7-T1 interlaminar epidural steroid injection..   Procedures: No procedures performed  No notes on file   Clinical History: MRI CERVICAL SPINE WITHOUT CONTRAST  TECHNIQUE: Multiplanar, multisequence MR imaging of the cervical spine was performed. No intravenous contrast was administered.  COMPARISON: Cervical spine radiographs 08/20/2017  MRI cervical spine 03/28/2010  FINDINGS: Alignment: Physiologic.  Vertebrae: No fracture, evidence of discitis, or bone lesion.  Cord: Normal signal and morphology.  Posterior Fossa, vertebral arteries, paraspinal tissues: Visualized posterior fossa is normal. Vertebral artery flow voids are preserved. No prevertebral soft tissue swelling.  Disc levels:  C1-C2: Normal.  C2-C3: Normal disc space and facets. No spinal canal or neuroforaminal  stenosis.  C3-C4: Small left subarticular disc protrusion without stenosis, unchanged.  C4-C5: Unchanged small central protrusion without stenosis.  C5-C6: Medium-sized left subarticular disc protrusion, unchanged in size, effacing the ventral thecal sac and causing moderate spinal canal stenosis.  C6-C7: Medium-sized central disc protrusion, slightly increased. Mild spinal canal stenosis with indentation of the ventral spinal cord.  C7-T1: Normal disc space and facets. No spinal canal or neuroforaminal stenosis.  IMPRESSION: 1. C5-C6 unchanged left subarticular disc protrusion with moderate spinal canal stenosis and deformity of the spinal cord without signal change. 2. C6-C7 slightly increased size of central disc protrusion with mild spinal canal stenosis.   Electronically Signed By: Deatra RobinsonKevin Herman M.D. On: 09/15/2017 19:50  He reports that  has never smoked. he has never used smokeless tobacco. No results for input(s): HGBA1C, LABURIC in the last 8760 hours.  Objective:  VS:  HT:    WT:   BMI:     BP:130/81  HR:68bpm  TEMP:97.8 F (36.6  C)(Oral)  RESP:98 % Physical Exam  Constitutional: He is oriented to person, place, and time. He appears well-developed and well-nourished. No distress.  HENT:  Head: Normocephalic and atraumatic.  Nose: Nose normal.  Mouth/Throat: Oropharynx is clear and moist.  Eyes: Conjunctivae are normal. Pupils are equal, round, and reactive to light.  Neck: Neck supple. No JVD present. No tracheal deviation present.  Cardiovascular: Regular rhythm and intact distal pulses.  Pulmonary/Chest: Effort normal and breath sounds normal.  Abdominal: Soft. He exhibits no distension. There is no rebound and no guarding.  Musculoskeletal: He exhibits no deformity.  Cervical spine evaluation decent range of motion of the cervical spine with more pain with right radiation and an equivocally positive Spurling's test to the right.  He does have some  tightness in the trapezius muscles bilaterally without focal trigger point.  He does have some increased muscle bulk of the trapezius and deltoids.  No atrophy right compared to left.  He seems to have good range of motion of both shoulders with some bilaterally.  He seems to have full strength bilaterally in all muscles tested.  He may have a little bit of weak numbness with long finger flexion on the right compared to left.  He has diminished reflexes bilaterally at the biceps and brachioradialis.  Feel like that was likely do it is just the fact that I could not get him to relax.  He has a negative Hoffman's test bilaterally.  Neurological: He is alert and oriented to person, place, and time. He exhibits normal muscle tone. Coordination normal.  Skin: Skin is warm. No rash noted.  Psychiatric: He has a normal mood and affect. His behavior is normal.  Nursing note and vitals reviewed.   Ortho Exam Imaging: No results found.  Past Medical/Family/Surgical/Social History: Medications & Allergies reviewed per EMR Patient Active Problem List   Diagnosis Date Noted  . Neck pain 08/20/2017   History reviewed. No pertinent past medical history. History reviewed. No pertinent family history. Past Surgical History:  Procedure Laterality Date  . ANTERIOR CRUCIATE LIGAMENT REPAIR    . MEDIAL COLLATERAL LIGAMENT AND LATERAL COLLATERAL LIGAMENT REPAIR, KNEE    . NASAL SEPTUM SURGERY     Social History   Occupational History  . Not on file  Tobacco Use  . Smoking status: Never Smoker  . Smokeless tobacco: Never Used  Substance and Sexual Activity  . Alcohol use: No    Alcohol/week: 0.0 oz  . Drug use: No  . Sexual activity: Yes

## 2017-10-18 ENCOUNTER — Encounter (INDEPENDENT_AMBULATORY_CARE_PROVIDER_SITE_OTHER): Payer: Self-pay | Admitting: Physical Medicine and Rehabilitation

## 2017-10-18 ENCOUNTER — Ambulatory Visit (INDEPENDENT_AMBULATORY_CARE_PROVIDER_SITE_OTHER): Payer: Managed Care, Other (non HMO)

## 2017-10-18 ENCOUNTER — Ambulatory Visit (INDEPENDENT_AMBULATORY_CARE_PROVIDER_SITE_OTHER): Payer: Managed Care, Other (non HMO) | Admitting: Physical Medicine and Rehabilitation

## 2017-10-18 VITALS — BP 122/78 | HR 61 | Temp 97.8°F

## 2017-10-18 DIAGNOSIS — M5010 Cervical disc disorder with radiculopathy, unspecified cervical region: Secondary | ICD-10-CM | POA: Diagnosis not present

## 2017-10-18 DIAGNOSIS — M5412 Radiculopathy, cervical region: Secondary | ICD-10-CM

## 2017-10-18 DIAGNOSIS — M4802 Spinal stenosis, cervical region: Secondary | ICD-10-CM

## 2017-10-18 MED ORDER — METHYLPREDNISOLONE ACETATE 80 MG/ML IJ SUSP
80.0000 mg | Freq: Once | INTRAMUSCULAR | Status: AC
  Administered 2017-10-18: 80 mg

## 2017-10-18 NOTE — Patient Instructions (Signed)

## 2017-10-18 NOTE — Progress Notes (Deleted)
Pt states pain in neck that radiates through the right arm with some tingling in his right thumb. No changes since last visit. +Driver, -BT, -Dye Allergies.

## 2017-10-18 NOTE — Progress Notes (Signed)
Danny GenreKevin L Kirby - 52 y.o. male MRN [id number]  Date of birth: 09/05/1965  Office Visit Note: Visit Date: 10/18/2017 PCP: Patient, No Pcp Per Referred by: Andrena Mewsigby, Michael D, DO  Subjective: Chief Complaint  Patient presents with  . Neck - Pain  . Right Arm - Pain   HPI: Danny Kirby is a 52 year old right-hand-dominant gentleman who comes in today for planned right C7-T1 interlaminar epidural steroid injection please see our prior evaluation and management note for further details and justification.    ROS Otherwise per HPI.  Assessment & Plan: Visit Diagnoses:  1. Cervical radiculopathy   2. Spinal stenosis of cervical region   3. Cervical disc disorder with radiculopathy     Plan: Findings:  Right C7-T1 interlaminar epidural steroid injection.    Meds & Orders:  Meds ordered this encounter  Medications  . methylPREDNISolone acetate (DEPO-MEDROL) injection 80 mg    Orders Placed This Encounter  Procedures  . XR C-ARM NO REPORT  . Epidural Steroid injection    Follow-up: Return if symptoms worsen or fail to improve, for Dr. Berline Choughigby.   Procedures: No procedures performed  Cervical Epidural Steroid Injection - Interlaminar Approach with Fluoroscopic Guidance  Patient: Danny Kirby      Date of Birth: 52/11/1965 MRN: [id number] PCP: Patient, No Pcp Per      Visit Date: 10/18/2017   Universal Protocol:    Date/Time: 10/18/1909:41 AM  Consent Given By: the patient  Position: PRONE  Additional Comments: Vital signs were monitored before and after the procedure. Patient was prepped and draped in the usual sterile fashion. The correct patient, procedure, and site was verified.   Injection Procedure Details:  Procedure Site One Meds Administered:  Meds ordered this encounter  Medications  . methylPREDNISolone acetate (DEPO-MEDROL) injection 80 mg     Laterality: Right  Location/Site: C7-T1  Needle size: 20 G  Needle type: Touhy  Needle Placement:  Paramedian epidural space  Findings:  -Comments: Excellent flow of contrast into the epidural space.  Procedure Details: Using a paramedian approach from the side mentioned above, the region overlying the inferior lamina was localized under fluoroscopic visualization and the soft tissues overlying this structure were infiltrated with 4 ml. of 1% Lidocaine without Epinephrine. A # 20 gauge, Tuohy needle was inserted into the epidural space using a paramedian approach.  The epidural space was localized using loss of resistance along with lateral and contralateral oblique bi-planar fluoroscopic views.  After negative aspirate for air, blood, and CSF, a 2 ml. volume of Isovue-250 was injected into the epidural space and the flow of contrast was observed. Radiographs were obtained for documentation purposes.   The injectate was administered into the level noted above.  Additional Comments:  The patient tolerated the procedure well Dressing: Band-Aid    Post-procedure details: Patient was observed during the procedure. Post-procedure instructions were reviewed.  Patient left the clinic in stable condition.   Clinical History: MRI CERVICAL SPINE WITHOUT CONTRAST  TECHNIQUE: Multiplanar, multisequence MR imaging of the cervical spine was performed. No intravenous contrast was administered.  COMPARISON: Cervical spine radiographs 08/20/2017  MRI cervical spine 03/28/2010  FINDINGS: Alignment: Physiologic.  Vertebrae: No fracture, evidence of discitis, or bone lesion.  Cord: Normal signal and morphology.  Posterior Fossa, vertebral arteries, paraspinal tissues: Visualized posterior fossa is normal. Vertebral artery flow voids are preserved. No prevertebral soft tissue swelling.  Disc levels:  C1-C2: Normal.  C2-C3: Normal disc space and facets. No spinal canal or neuroforaminal  stenosis.  C3-C4: Small left subarticular disc protrusion without stenosis, unchanged.  C4-C5:  Unchanged small central protrusion without stenosis.  C5-C6: Medium-sized left subarticular disc protrusion, unchanged in size, effacing the ventral thecal sac and causing moderate spinal canal stenosis.  C6-C7: Medium-sized central disc protrusion, slightly increased. Mild spinal canal stenosis with indentation of the ventral spinal cord.  C7-T1: Normal disc space and facets. No spinal canal or neuroforaminal stenosis.  IMPRESSION: 1. C5-C6 unchanged left subarticular disc protrusion with moderate spinal canal stenosis and deformity of the spinal cord without signal change. 2. C6-C7 slightly increased size of central disc protrusion with mild spinal canal stenosis.   Electronically Signed By: Deatra Robinson M.D. On: 09/15/2017 19:50  He reports that  has never smoked. he has never used smokeless tobacco. No results for input(s): HGBA1C, LABURIC in the last 8760 hours.  Objective:  VS:  HT:    WT:   BMI:     BP:122/78  HR:61bpm  TEMP:97.8 F (36.6 C)(Oral)  RESP:98 % Physical Exam  Ortho Exam Imaging: Xr C-arm No Report  Result Date: 10/18/2017 Please see Notes or Procedures tab for imaging impression.   Past Medical/Family/Surgical/Social History: Medications & Allergies reviewed per EMR Patient Active Problem List   Diagnosis Date Noted  . Neck pain 08/20/2017   History reviewed. No pertinent past medical history. History reviewed. No pertinent family history. Past Surgical History:  Procedure Laterality Date  . ANTERIOR CRUCIATE LIGAMENT REPAIR    . MEDIAL COLLATERAL LIGAMENT AND LATERAL COLLATERAL LIGAMENT REPAIR, KNEE    . NASAL SEPTUM SURGERY     Social History   Occupational History  . Not on file  Tobacco Use  . Smoking status: Never Smoker  . Smokeless tobacco: Never Used  Substance and Sexual Activity  . Alcohol use: No    Alcohol/week: 0.0 oz  . Drug use: No  . Sexual activity: Yes

## 2017-10-18 NOTE — Procedures (Signed)
Cervical Epidural Steroid Injection - Interlaminar Approach with Fluoroscopic Guidance  Patient: Danny Kirby      Date of Birth: 12/30/1965 MRN: [id number] PCP: Patient, No Pcp Per      Visit Date: 10/18/2017   Universal Protocol:    Date/Time: 10/18/1909:41 AM  Consent Given By: the patient  Position: PRONE  Additional Comments: Vital signs were monitored before and after the procedure. Patient was prepped and draped in the usual sterile fashion. The correct patient, procedure, and site was verified.   Injection Procedure Details:  Procedure Site One Meds Administered:  Meds ordered this encounter  Medications  . methylPREDNISolone acetate (DEPO-MEDROL) injection 80 mg     Laterality: Right  Location/Site: C7-T1  Needle size: 20 G  Needle type: Touhy  Needle Placement: Paramedian epidural space  Findings:  -Comments: Excellent flow of contrast into the epidural space.  Procedure Details: Using a paramedian approach from the side mentioned above, the region overlying the inferior lamina was localized under fluoroscopic visualization and the soft tissues overlying this structure were infiltrated with 4 ml. of 1% Lidocaine without Epinephrine. A # 20 gauge, Tuohy needle was inserted into the epidural space using a paramedian approach.  The epidural space was localized using loss of resistance along with lateral and contralateral oblique bi-planar fluoroscopic views.  After negative aspirate for air, blood, and CSF, a 2 ml. volume of Isovue-250 was injected into the epidural space and the flow of contrast was observed. Radiographs were obtained for documentation purposes.   The injectate was administered into the level noted above.  Additional Comments:  The patient tolerated the procedure well Dressing: Band-Aid    Post-procedure details: Patient was observed during the procedure. Post-procedure instructions were reviewed.  Patient left the clinic in stable  condition.

## 2017-11-08 ENCOUNTER — Emergency Department (HOSPITAL_COMMUNITY)
Admission: EM | Admit: 2017-11-08 | Discharge: 2017-11-08 | Discharge status: Absent without leave | Payer: Managed Care, Other (non HMO)

## 2017-11-09 ENCOUNTER — Inpatient Hospital Stay (HOSPITAL_COMMUNITY)
Admission: EM | Admit: 2017-11-09 | Discharge: 2017-11-11 | DRG: 603 | Disposition: A | Discharge status: Home / Self Care | Payer: Managed Care, Other (non HMO) | Attending: Family Medicine | Admitting: Family Medicine

## 2017-11-09 ENCOUNTER — Other Ambulatory Visit: Payer: Self-pay

## 2017-11-09 ENCOUNTER — Encounter (HOSPITAL_COMMUNITY): Payer: Self-pay

## 2017-11-09 ENCOUNTER — Emergency Department (HOSPITAL_COMMUNITY): Payer: Managed Care, Other (non HMO)

## 2017-11-09 DIAGNOSIS — M7989 Other specified soft tissue disorders: Secondary | ICD-10-CM | POA: Diagnosis not present

## 2017-11-09 DIAGNOSIS — L03116 Cellulitis of left lower limb: Secondary | ICD-10-CM | POA: Diagnosis not present

## 2017-11-09 DIAGNOSIS — M4802 Spinal stenosis, cervical region: Secondary | ICD-10-CM | POA: Diagnosis present

## 2017-11-09 DIAGNOSIS — L03119 Cellulitis of unspecified part of limb: Secondary | ICD-10-CM

## 2017-11-09 DIAGNOSIS — Z79899 Other long term (current) drug therapy: Secondary | ICD-10-CM

## 2017-11-09 DIAGNOSIS — M5412 Radiculopathy, cervical region: Secondary | ICD-10-CM | POA: Diagnosis present

## 2017-11-09 DIAGNOSIS — L0390 Cellulitis, unspecified: Secondary | ICD-10-CM | POA: Diagnosis present

## 2017-11-09 LAB — CBC WITH DIFFERENTIAL/PLATELET
BASOS ABS: 0 10*3/uL (ref 0.0–0.1)
Basophils Relative: 0 %
EOS ABS: 0.3 10*3/uL (ref 0.0–0.7)
Eosinophils Relative: 3 %
HCT: 41.4 % (ref 39.0–52.0)
Hemoglobin: 13.3 g/dL (ref 13.0–17.0)
LYMPHS PCT: 23 %
Lymphs Abs: 2.2 10*3/uL (ref 0.7–4.0)
MCH: 29 pg (ref 26.0–34.0)
MCHC: 32.1 g/dL (ref 30.0–36.0)
MCV: 90.4 fL (ref 78.0–100.0)
MONO ABS: 0.8 10*3/uL (ref 0.1–1.0)
Monocytes Relative: 8 %
Neutro Abs: 6.7 10*3/uL (ref 1.7–7.7)
Neutrophils Relative %: 66 %
PLATELETS: 258 10*3/uL (ref 150–400)
RBC: 4.58 MIL/uL (ref 4.22–5.81)
RDW: 12.9 % (ref 11.5–15.5)
WBC: 9.9 10*3/uL (ref 4.0–10.5)

## 2017-11-09 LAB — BASIC METABOLIC PANEL
ANION GAP: 12 (ref 5–15)
BUN: 19 mg/dL (ref 6–20)
CALCIUM: 9 mg/dL (ref 8.9–10.3)
CO2: 24 mmol/L (ref 22–32)
Chloride: 106 mmol/L (ref 101–111)
Creatinine, Ser: 1.06 mg/dL (ref 0.61–1.24)
GFR calc Af Amer: >60 mL/min (ref >60)
GLUCOSE: 102 mg/dL — AB (ref 65–99)
Potassium: 3.8 mmol/L (ref 3.5–5.1)
SODIUM: 142 mmol/L (ref 135–145)

## 2017-11-09 LAB — I-STAT CG4 LACTIC ACID, ED: LACTIC ACID, VENOUS: 1.29 mmol/L (ref 0.5–1.9)

## 2017-11-09 MED ORDER — SODIUM CHLORIDE 0.9 % IV SOLN
INTRAVENOUS | Status: DC
  Administered 2017-11-09: 15:00:00 via INTRAVENOUS

## 2017-11-09 MED ORDER — ONDANSETRON HCL 4 MG/2ML IJ SOLN: 4.0000 mg | Freq: Four times a day (QID) | INTRAMUSCULAR | Status: DC | PRN

## 2017-11-09 MED ORDER — IOPAMIDOL (ISOVUE-300) INJECTION 61%
100.0000 mL | Freq: Once | INTRAVENOUS | Status: AC | PRN
  Administered 2017-11-09: 100 mL via INTRAVENOUS

## 2017-11-09 MED ORDER — IOPAMIDOL (ISOVUE-300) INJECTION 61%
INTRAVENOUS | Status: AC
  Filled 2017-11-09: qty 100

## 2017-11-09 MED ORDER — PIPERACILLIN-TAZOBACTAM 3.375 G IVPB 30 MIN
3.3750 g | Freq: Once | INTRAVENOUS | Status: AC
  Administered 2017-11-09: 3.375 g via INTRAVENOUS
  Filled 2017-11-09: qty 50

## 2017-11-09 MED ORDER — TRAMADOL HCL 50 MG PO TABS
50.0000 mg | ORAL_TABLET | Freq: Four times a day (QID) | ORAL | Status: DC | PRN
  Administered 2017-11-09 – 2017-11-11 (×4): 50 mg via ORAL
  Filled 2017-11-09 (×4): qty 1

## 2017-11-09 MED ORDER — ACETAMINOPHEN 325 MG PO TABS
650.0000 mg | ORAL_TABLET | Freq: Four times a day (QID) | ORAL | Status: DC | PRN
  Administered 2017-11-10: 650 mg via ORAL
  Filled 2017-11-09: qty 2

## 2017-11-09 MED ORDER — SODIUM CHLORIDE 0.9 % IJ SOLN
INTRAMUSCULAR | Status: AC
  Filled 2017-11-09: qty 50

## 2017-11-09 MED ORDER — GABAPENTIN 400 MG PO CAPS
400.0000 mg | ORAL_CAPSULE | Freq: Three times a day (TID) | ORAL | Status: DC
  Administered 2017-11-09 – 2017-11-11 (×5): 400 mg via ORAL
  Filled 2017-11-09 (×5): qty 1

## 2017-11-09 MED ORDER — ACETAMINOPHEN 650 MG RE SUPP: 650.0000 mg | Freq: Four times a day (QID) | RECTAL | Status: DC | PRN

## 2017-11-09 MED ORDER — PIPERACILLIN-TAZOBACTAM 3.375 G IVPB
3.3750 g | Freq: Three times a day (TID) | INTRAVENOUS | Status: DC
  Administered 2017-11-10: 3.375 g via INTRAVENOUS
  Filled 2017-11-09 (×2): qty 50

## 2017-11-09 MED ORDER — SODIUM CHLORIDE 0.9 % IV SOLN: INTRAVENOUS | Status: DC

## 2017-11-09 MED ORDER — VANCOMYCIN HCL 10 G IV SOLR
1750.0000 mg | INTRAVENOUS | Status: DC
  Administered 2017-11-10 – 2017-11-11 (×2): 1750 mg via INTRAVENOUS
  Filled 2017-11-09 (×2): qty 1750

## 2017-11-09 MED ORDER — PIPERACILLIN-TAZOBACTAM 3.375 G IVPB 30 MIN: 3.3750 g | Freq: Once | INTRAVENOUS | Status: DC

## 2017-11-09 MED ORDER — VANCOMYCIN HCL IN DEXTROSE 1-5 GM/200ML-% IV SOLN
1000.0000 mg | Freq: Once | INTRAVENOUS | Status: AC
Start: 2017-11-09 — End: 2017-11-09
  Administered 2017-11-09: 1000 mg via INTRAVENOUS
  Filled 2017-11-09: qty 200

## 2017-11-09 MED ORDER — ENOXAPARIN SODIUM 40 MG/0.4ML ~~LOC~~ SOLN
40.0000 mg | SUBCUTANEOUS | Status: DC
  Administered 2017-11-09 – 2017-11-10 (×2): 40 mg via SUBCUTANEOUS
  Filled 2017-11-09 (×2): qty 0.4

## 2017-11-09 MED ORDER — ONDANSETRON HCL 4 MG PO TABS: 4.0000 mg | ORAL_TABLET | Freq: Four times a day (QID) | ORAL | Status: DC | PRN

## 2017-11-09 NOTE — ED Notes (Signed)
ED TO INPATIENT HANDOFF REPORT  Name/Age/Gender Danny Kirby 52 y.o. male  Code Status Code Status History    This patient does not have a recorded code status. Please follow your organizational policy for patients in this situation.      Home/SNF/Other Home  Chief Complaint leg infection  Level of Care/Admitting Diagnosis ED Disposition    ED Disposition Condition Comment   Admit  Hospital Area: [facility] [[id number]]  Level of Care: Med-Surg [16]  Diagnosis: Cellulitis [644034]  Admitting Physician: Hosie Poisson [[id number]]  Attending Physician: Hosie Poisson [[id number]]  PT Class (Do Not Modify): Observation [104]  PT Acc Code (Do Not Modify): Observation [10022]       Medical History History reviewed. No pertinent past medical history.  Allergies No Known Allergies  IV Location/Drains/Wounds Patient Lines/Drains/Airways Status   Active Line/Drains/Airways    None          Labs/Imaging Results for orders placed or performed during the hospital encounter of 11/09/17 (from the past 48 hour(s))  CBC with Differential/Platelet     Status: None   Collection Time: 11/09/17  1:37 PM  Result Value Ref Range   WBC 9.9 4.0 - 10.5 K/uL   RBC 4.58 4.22 - 5.81 MIL/uL   Hemoglobin 13.3 13.0 - 17.0 g/dL   HCT 41.4 39.0 - 52.0 %   MCV 90.4 78.0 - 100.0 fL   MCH 29.0 26.0 - 34.0 pg   MCHC 32.1 30.0 - 36.0 g/dL   RDW 12.9 11.5 - 15.5 %   Platelets 258 150 - 400 K/uL   Neutrophils Relative % 66 %   Neutro Abs 6.7 1.7 - 7.7 K/uL   Lymphocytes Relative 23 %   Lymphs Abs 2.2 0.7 - 4.0 K/uL   Monocytes Relative 8 %   Monocytes Absolute 0.8 0.1 - 1.0 K/uL   Eosinophils Relative 3 %   Eosinophils Absolute 0.3 0.0 - 0.7 K/uL   Basophils Relative 0 %   Basophils Absolute 0.0 0.0 - 0.1 K/uL    Comment: Performed at [facility], [place] [address], [place], [place] [address]  Basic metabolic panel     Status: Abnormal   Collection Time:  11/09/17  1:37 PM  Result Value Ref Range   Sodium 142 135 - 145 mmol/L   Potassium 3.8 3.5 - 5.1 mmol/L   Chloride 106 101 - 111 mmol/L   CO2 24 22 - 32 mmol/L   Glucose, Bld 102 (H) 65 - 99 mg/dL   BUN 19 6 - 20 mg/dL   Creatinine, Ser 1.06 0.61 - 1.24 mg/dL   Calcium 9.0 8.9 - 10.3 mg/dL   GFR calc non Af Amer >60 >60 mL/min   GFR calc Af Amer >60 >60 mL/min    Comment: (NOTE) The eGFR has been calculated using the CKD EPI equation. This calculation has not been validated in all clinical situations. eGFR's persistently <60 mL/min signify possible Chronic Kidney Disease.    Anion gap 12 5 - 15    Comment: Performed at [facility], [place] [address], [place], [place] [address]  I-Stat CG4 Lactic Acid, ED     Status: None   Collection Time: 11/09/17  2:18 PM  Result Value Ref Range   Lactic Acid, Venous 1.29 0.5 - 1.9 mmol/L   Ct Extremity Lower Left W Contrast  Result Date: 11/09/2017 CLINICAL DATA:  Pain, redness and swelling of the left calf for 5 days. Question abscess. EXAM:  CT OF THE LOWER LEFT EXTREMITY WITH CONTRAST TECHNIQUE: Multidetector CT imaging of the lower left extremity was performed according to the standard protocol following intravenous contrast administration. COMPARISON:  None. CONTRAST:  100 ml ISOVUE-300 IOPAMIDOL (ISOVUE-300) INJECTION 61% FINDINGS: Bones/Joint/Cartilage No fracture or dislocation. No bony destructive change or periosteal reaction. No avascular necrosis of the femoral head. Joint spaces are preserved. Ligaments Suboptimally assessed by CT. Muscles and Tendons No gas is identified within muscle or tracking along fascial planes. No intramuscular fluid collection is seen. Soft tissues Stranding in subcutaneous fat extends from approximately the mid thigh to the ankle and is worst on the lateral side. No rim enhancing fluid collection is identified. No radiopaque foreign body or soft tissue gas is seen. There is no knee or tibiotalar  joint effusion. IMPRESSION: Stranding in subcutaneous fat of the left leg from the mid thigh to the ankle is worst laterally and most consistent with cellulitis. No abscess is identified. Negative for CT evidence of fasciitis, osteomyelitis or septic joint. Electronically Signed   By: Inge Rise M.D.   On: 11/09/2017 16:01    Pending Labs Unresulted Labs (From admission, onward)   Start     Ordered   11/09/17 1337  Culture, blood (Routine X 2) w Reflex to ID Panel  BLOOD CULTURE X 2,   R     11/09/17 1336   Signed and Held  HIV antibody (Routine Testing)  Once,   R     Signed and Held   Signed and Held  Creatinine, serum  (enoxaparin (LOVENOX)    CrCl >/= 30 ml/min)  Weekly,   R    Comments:  while on enoxaparin therapy    Signed and Held      Vitals/Pain Today's Vitals   11/09/17 1630 11/09/17 1700 11/09/17 1734 11/09/17 1800  BP: 127/78 120/80 115/86 132/81  Pulse: 66 67 75 61  Resp:   18   Temp:      TempSrc:      SpO2: 99% 97% 99% 96%  Weight:      Height:      PainSc:        Isolation Precautions No active isolations  Medications Medications  0.9 %  sodium chloride infusion ( Intravenous New Bag/Given 11/09/17 1431)  iopamidol (ISOVUE-300) 61 % injection (not administered)  sodium chloride 0.9 % injection (not administered)  piperacillin-tazobactam (ZOSYN) IVPB 3.375 g (not administered)  0.9 %  sodium chloride infusion (not administered)  vancomycin (VANCOCIN) IVPB 1000 mg/200 mL premix (0 mg Intravenous Stopped 11/09/17 1538)  iopamidol (ISOVUE-300) 61 % injection 100 mL (100 mLs Intravenous Contrast Given 11/09/17 1523)    Mobility walks

## 2017-11-09 NOTE — ED Provider Notes (Signed)
[facility]-EMERGENCY DEPT Provider Note   CSN: [id number] Arrival date & time: 11/09/17  1137     History   Chief Complaint Chief Complaint  Patient presents with  . leg swellilng  . Redness    HPI Danny Kirby is a 52 y.o. male.  52 year old male presents with increasing erythema and swelling to his left lower extremity.  Seen by his doctor x2 over the last several days and diagnosed with cellulitis.  He was initially placed on clindamycin as well as doxycycline as the erythema had increased from a focal region that have been incised without pus drainage.  No fever but some chills.  No tingling or numbness to his distal left foot.  No other systemic symptoms.  Seen by his doctor today and noted that he has had increasing erythema extending up to his left thigh was sent here for further management.  Continues to note drainage from the area that was incised there is characterizes purulent      History reviewed. No pertinent past medical history.  Patient Active Problem List   Diagnosis Date Noted  . Neck pain 08/20/2017    Past Surgical History:  Procedure Laterality Date  . ANTERIOR CRUCIATE LIGAMENT REPAIR    . MEDIAL COLLATERAL LIGAMENT AND LATERAL COLLATERAL LIGAMENT REPAIR, KNEE    . NASAL SEPTUM SURGERY         Home Medications    Prior to Admission medications   Medication Sig Start Date End Date Taking? Authorizing Provider  diazepam (VALIUM) 5 MG tablet Take 1 by mouth 1 to 2 hours pre-procedure. May repeat if necessary. 10/06/17   Tyrell Antonio, MD  gabapentin (NEURONTIN) 400 MG capsule Take 1 capsule (400 mg total) by mouth 3 (three) times daily. 08/20/17   Andrena Mews, DO    Family History History reviewed. No pertinent family history.  Social History Social History   Tobacco Use  . Smoking status: Never Smoker  . Smokeless tobacco: Never Used  Substance Use Topics  . Alcohol use: No    Alcohol/week: 0.0 oz  .  Drug use: No     Allergies   Patient has no known allergies.   Review of Systems Review of Systems  All other systems reviewed and are negative.    Physical Exam Updated Vital Signs BP 132/80 (BP Location: Left Arm)   Pulse 68   Temp 98.2 F (36.8 C) (Oral)   Resp 16   Ht 1.727 m (5\' 8" )   Wt 90.7 kg (200 lb)   SpO2 98%   BMI 30.41 kg/m   Physical Exam  Constitutional: He is oriented to person, place, and time. He appears well-developed and well-nourished.  Non-toxic appearance. No distress.  HENT:  Head: Normocephalic and atraumatic.  Eyes: Conjunctivae, EOM and lids are normal. Pupils are equal, round, and reactive to light.  Neck: Normal range of motion. Neck supple. No tracheal deviation present. No thyroid mass present.  Cardiovascular: Normal rate, regular rhythm and normal heart sounds. Exam reveals no gallop.  No murmur heard. Pulmonary/Chest: Effort normal and breath sounds normal. No stridor. No respiratory distress. He has no decreased breath sounds. He has no wheezes. He has no rhonchi. He has no rales.  Abdominal: Soft. Normal appearance and bowel sounds are normal. He exhibits no distension. There is no tenderness. There is no rebound and no CVA tenderness.  Musculoskeletal: Normal range of motion. He exhibits no edema or tenderness.  Legs: Neurological: He is alert and oriented to person, place, and time. He has normal strength. No cranial nerve deficit or sensory deficit. GCS eye subscore is 4. GCS verbal subscore is 5. GCS motor subscore is 6.  Skin: Skin is warm and dry. No abrasion and no rash noted.  Psychiatric: He has a normal mood and affect. His speech is normal and behavior is normal.  Nursing note and vitals reviewed.    ED Treatments / Results  Labs (all labs ordered are listed, but only abnormal results are displayed) Labs Reviewed  CULTURE, BLOOD (ROUTINE X 2)  CULTURE, BLOOD (ROUTINE X 2)  CBC WITH DIFFERENTIAL/PLATELET  BASIC  METABOLIC PANEL  I-STAT CG4 LACTIC ACID, ED    EKG  EKG Interpretation None       Radiology No results found.  Procedures Procedures (including critical care time)  Medications Ordered in ED Medications  0.9 %  sodium chloride infusion (not administered)  vancomycin (VANCOCIN) IVPB 1000 mg/200 mL premix (not administered)     Initial Impression / Assessment and Plan / ED Course  I have reviewed the triage vital signs and the nursing notes.  Pertinent labs & imaging results that were available during my care of the patient were reviewed by me and considered in my medical decision making (see chart for details).     Blood cx ordered Pt started on iv abx Ct of le pending to rule out necrotizing facitis Signed out to next provider  Final Clinical Impressions(s) / ED Diagnoses   Final diagnoses:  None    ED Discharge Orders    None       Lorre NickAllen, Nava Song, MD 11/11/17 [phone]

## 2017-11-09 NOTE — ED Triage Notes (Signed)
Patient has an abscess to the left calf area x 5 days. [facility] the  Abs[facility]. Today, the area has increased in redness and the patient now has swelling to the left leg.

## 2017-11-09 NOTE — H&P (Signed)
History and Physical    Danny Kirby ZOX:[id number] DOB: [birth year] DOA: 11/09/2017  PCP: Patient, No Pcp Per Patient coming from:home.   I have personally briefly reviewed patient's old medical records in [facility]  Chief Complaint: worsening left leg swelling, tenderness and redness.   HPI: Danny Kirby is a 52 y.o. male with medical history significant of cervical radiculopathy, spinal stenosis at the cervical area, comes in for worsening redness, swelling and pain of the left leg since thursday. It started as a small boil and has been worsening since then.  Pr reports he was on two different antibiotics, clindamycin and doxycycline but his redness, pain and swelling continues to worsen. Pt was seen at PCP office today and was referred to ED, for further evaluation. On arrival to ED, he was afebrile, with normal vitals. He underwent CT of the left leg showing Stranding in subcutaneous fat of the left leg from the mid thigh to the ankle is worst laterally and most consistent with cellulitis. No abscess is identified. Negative for CT evidence of fasciitis, osteomyelitis or septic joint. He also reports subjective fevers and chills. No other complaints.  He was referred to med service for admission for failed outpatient therapy.    Review of Systems: As per HPI otherwise 10 point review of systems negative.    History reviewed. No pertinent past medical history.  Past Surgical History:  Procedure Laterality Date  . ANTERIOR CRUCIATE LIGAMENT REPAIR    . MEDIAL COLLATERAL LIGAMENT AND LATERAL COLLATERAL LIGAMENT REPAIR, KNEE    . NASAL SEPTUM SURGERY       reports that  has never smoked. he has never used smokeless tobacco. He reports that he does not drink alcohol or use drugs.  No Known Allergies  History reviewed. No pertinent family history.   Prior to Admission medications   Medication Sig Start Date End Date Taking? Authorizing Provider  clindamycin (CLEOCIN) 300 MG  capsule Take 300 mg by mouth 3 (three) times daily. 11/06/17  Yes [provider]  doxycycline (VIBRAMYCIN) 100 MG capsule Take 100 mg by mouth 2 (two) times daily. 11/08/17  Yes [provider]  gabapentin (NEURONTIN) 400 MG capsule Take 1 capsule (400 mg total) by mouth 3 (three) times daily. 08/20/17  Yes Andrena Mews, DO  ibuprofen (ADVIL,MOTRIN) 600 MG tablet Take 600 mg by mouth every 6 (six) hours as needed for mild pain.   Yes [provider]  diazepam (VALIUM) 5 MG tablet Take 1 by mouth 1 to 2 hours pre-procedure. May repeat if necessary. Patient not taking: Reported on 11/09/2017 10/06/17   Tyrell Antonio, MD    Physical Exam: Vitals:   11/09/17 1500 11/09/17 1545 11/09/17 1613 11/09/17 1630  BP: 124/77 125/84 125/75 127/78  Pulse: 62 69 69 66  Resp:   18   Temp:      TempSrc:      SpO2: 99% 99% 99% 99%  Weight:      Height:        Constitutional: NAD, calm, comfortable Vitals:   11/09/17 1500 11/09/17 1545 11/09/17 1613 11/09/17 1630  BP: 124/77 125/84 125/75 127/78  Pulse: 62 69 69 66  Resp:   18   Temp:      TempSrc:      SpO2: 99% 99% 99% 99%  Weight:      Height:       Eyes: PERRL, lids and conjunctivae normal ENMT: Mucous membranes are moist. Posterior pharynx clear  of any exudate or lesions.Normal dentition.  Neck: normal, supple, no masses, no thyromegaly Respiratory: clear to auscultation bilaterally, no wheezing, no crackles. Normal respiratory effort. No accessory muscle use.  Cardiovascular: Regular rate and rhythm, no murmurs / rubs / gallops. No extremity edema. 2+ pedal pulses. No carotid bruits.  Abdomen: no tenderness, no masses palpated. No hepatosplenomegaly. Bowel sounds positive.  Musculoskeletal: redness, swelling and pain over the posterior area of the left lower extremity extending from the mid thigh to the leg.  Skin: no rashes, lesions, ulcers. No induration Neurologic: CN 2-12 grossly intact. Sensation intact,  DTR normal. Strength 5/5 in all 4.  Psychiatric: Normal judgment and insight. Alert and oriented x 3. Normal mood.     Labs on Admission: I have personally reviewed following labs and imaging studies  CBC: Recent Labs  Lab 11/09/17 1337  WBC 9.9  NEUTROABS 6.7  HGB 13.3  HCT 41.4  MCV 90.4  PLT 258   Basic Metabolic Panel: Recent Labs  Lab 11/09/17 1337  NA 142  K 3.8  CL 106  CO2 24  GLUCOSE 102*  BUN 19  CREATININE 1.06  CALCIUM 9.0   GFR: Estimated Creatinine Clearance: 90.1 mL/min (by C-G formula based on SCr of 1.06 mg/dL). Liver Function Tests: No results for input(s): AST, ALT, ALKPHOS, BILITOT, PROT, ALBUMIN in the last 168 hours. No results for input(s): LIPASE, AMYLASE in the last 168 hours. No results for input(s): AMMONIA in the last 168 hours. Coagulation Profile: No results for input(s): INR, PROTIME in the last 168 hours. Cardiac Enzymes: No results for input(s): CKTOTAL, CKMB, CKMBINDEX, TROPONINI in the last 168 hours. BNP (last 3 results) No results for input(s): PROBNP in the last 8760 hours. HbA1C: No results for input(s): HGBA1C in the last 72 hours. CBG: No results for input(s): GLUCAP in the last 168 hours. Lipid Profile: No results for input(s): CHOL, HDL, LDLCALC, TRIG, CHOLHDL, LDLDIRECT in the last 72 hours. Thyroid Function Tests: No results for input(s): TSH, T4TOTAL, FREET4, T3FREE, THYROIDAB in the last 72 hours. Anemia Panel: No results for input(s): VITAMINB12, FOLATE, FERRITIN, TIBC, IRON, RETICCTPCT in the last 72 hours. Urine analysis: No results found for: COLORURINE, APPEARANCEUR, LABSPEC, PHURINE, GLUCOSEU, HGBUR, BILIRUBINUR, KETONESUR, PROTEINUR, UROBILINOGEN, NITRITE, LEUKOCYTESUR  Radiological Exams on Admission: Ct Extremity Lower Left W Contrast  Result Date: 11/09/2017 CLINICAL DATA:  Pain, redness and swelling of the left calf for 5 days. Question abscess. EXAM: CT OF THE LOWER LEFT EXTREMITY WITH CONTRAST  TECHNIQUE: Multidetector CT imaging of the lower left extremity was performed according to the standard protocol following intravenous contrast administration. COMPARISON:  None. CONTRAST:  100 ml ISOVUE-300 IOPAMIDOL (ISOVUE-300) INJECTION 61% FINDINGS: Bones/Joint/Cartilage No fracture or dislocation. No bony destructive change or periosteal reaction. No avascular necrosis of the femoral head. Joint spaces are preserved. Ligaments Suboptimally assessed by CT. Muscles and Tendons No gas is identified within muscle or tracking along fascial planes. No intramuscular fluid collection is seen. Soft tissues Stranding in subcutaneous fat extends from approximately the mid thigh to the ankle and is worst on the lateral side. No rim enhancing fluid collection is identified. No radiopaque foreign body or soft tissue gas is seen. There is no knee or tibiotalar joint effusion. IMPRESSION: Stranding in subcutaneous fat of the left leg from the mid thigh to the ankle is worst laterally and most consistent with cellulitis. No abscess is identified. Negative for CT evidence of fasciitis, osteomyelitis or septic joint. Electronically Signed   By:  Drusilla Kanner M.D.   On: 11/09/2017 16:01    EKG:not done.  Assessment/Plan Active Problems:   Cellulitis   Cellulitis of the left lower extremity:  Admit for IV antibiotics. Blood cultures drawn and pending.  CT does not show any abscess.  Get US venous duplex to evaluate for DVT.  Pain control and gently hydrate.    Cervical radiculopathy:  On gabapentin and stable.   DVT prophylaxis: lovenox.  Code Status: full code.  Family Communication: family at bedside.  Disposition Plan: pending improvement in cellulitis.  Consults called: none.  Admission status: obs med surg.   Kathlen Mody MD [facility] Pager [phone]   If 7PM-7AM, please contact night-coverage [email or web address] Password [facility]  11/09/2017, 5:27 PM

## 2017-11-09 NOTE — Progress Notes (Signed)
Pharmacy Antibiotic Note  Danny BaumgartenKevin L Kirby is a 52 y.o. male admitted on 11/09/2017 with cellulitis.  Pharmacy has been consulted for Vancomycin & Zosyn dosing. Now with purulent drainage from previous incision site at PCP office (seen by PCP x2 prior to admit) started Clindamycin on 3/16 with no improvement, added Doxycycline on 3/18.   Plan: Vancomycin 1gm x1 in ED, then 1750mg  q24, estimated AUC 493, using SCr 1.06 Zosyn 3.375gm q8, infused over 4 hr  Height: 5\' 8"  (172.7 cm) Weight: 200 lb (90.7 kg) IBW/kg (Calculated) : 68.4  Temp (24hrs), Avg:98.2 F (36.8 C), Min:98.2 F (36.8 C), Max:98.2 F (36.8 C)  Recent Labs  Lab 11/09/17 1337 11/09/17 1418  WBC 9.9  --   CREATININE 1.06  --   LATICACIDVEN  --  1.29    Estimated Creatinine Clearance: 90.1 mL/min (by C-G formula based on SCr of 1.06 mg/dL).    No Known Allergies  Antimicrobials this admission: 3/19 Vancomycin >>  3/19 Zosyn >>   Dose adjustments this admission:  Microbiology results: 3/19 BCx: sent  Thank you for allowing pharmacy to be a part of this patient's care.  Otho BellowsGreen, Rebeckah Masih L PharmD Pager [phone] 11/09/2017, 6:26 PM

## 2017-11-10 ENCOUNTER — Observation Stay (HOSPITAL_COMMUNITY): Payer: Managed Care, Other (non HMO)

## 2017-11-10 DIAGNOSIS — Z79899 Other long term (current) drug therapy: Secondary | ICD-10-CM | POA: Diagnosis not present

## 2017-11-10 DIAGNOSIS — M7989 Other specified soft tissue disorders: Secondary | ICD-10-CM | POA: Diagnosis present

## 2017-11-10 DIAGNOSIS — L03116 Cellulitis of left lower limb: Secondary | ICD-10-CM | POA: Diagnosis not present

## 2017-11-10 DIAGNOSIS — M5412 Radiculopathy, cervical region: Secondary | ICD-10-CM | POA: Diagnosis present

## 2017-11-10 DIAGNOSIS — R609 Edema, unspecified: Secondary | ICD-10-CM

## 2017-11-10 DIAGNOSIS — M4802 Spinal stenosis, cervical region: Secondary | ICD-10-CM | POA: Diagnosis present

## 2017-11-10 LAB — HIV ANTIBODY (ROUTINE TESTING W REFLEX): HIV SCREEN 4TH GENERATION: NONREACTIVE

## 2017-11-10 MED ORDER — SODIUM CHLORIDE 0.9 % IV SOLN
2.0000 g | INTRAVENOUS | Status: DC
  Administered 2017-11-10 – 2017-11-11 (×2): 2 g via INTRAVENOUS
  Filled 2017-11-10 (×2): qty 2

## 2017-11-10 NOTE — Progress Notes (Signed)
PROGRESS NOTE    Mariane BaumgartenKevin L Heggie  ZOX:[id number] DOB: 03/21/1966 DOA: 11/09/2017 PCP: Patient, No Pcp Per   Brief Narrative:   52 year old with past medical history relevant for cervical neuropathy who comes in with left posterior leg cellulitis with outpatient failure.   Assessment & Plan:   Active Problems:   Cellulitis   #) Left leg cellulitis: CT scan shows no evidence of deeper infection. -Continue ceftriaxone and vancomycin started 11/09/2017 -Follow-up DVT ultrasound  #) Cervical neuropathy: -Continue gabapentin 400 mg 3 times daily  Fluids: Tolerating p.o. Elect lites: Monitor and supplement Nutrition: Regular diet  Prophylaxis: enox  Disposition: Pending improvement of cellulitis    Consultants:   None  Procedures: (Don't include imaging studies which can be auto populated. Include things that cannot be auto populated i.e. Echo, Carotid and venous dopplers, Foley, Bipap, HD, tubes/drains, wound vac, central lines etc)  None  Antimicrobials: (specify start and planned stop date. Auto populated tables are space occupying and do not give end dates)  IV ceftriaxone and vancomycin started 11/10/2017   Subjective: Patient reports that his symptoms have not improved much since admission.  They certainly have not gotten worse.  He denies any numbness or tingling down that leg however does have significant amounts of swelling.  Objective: Vitals:   11/09/17 1734 11/09/17 1800 11/09/17 1901 11/09/17 2122  BP: 115/86 132/81 124/75 116/72  Pulse: 75 61 65 73  Resp: 18  18 14   Temp:   98.1 F (36.7 C) 98 F (36.7 C)  TempSrc:   Oral Oral  SpO2: 99% 96% 100% 96%  Weight:      Height:        Intake/Output Summary (Last 24 hours) at 11/10/2017 1242 Last data filed at 11/10/2017 1030 Gross per 24 hour  Intake 240 ml  Output -  Net 240 ml   Filed Weights   11/09/17 1204  Weight: 90.7 kg (200 lb)    Examination:  General exam: Appears calm and  comfortable  Respiratory system: Clear to auscultation. Respiratory effort normal. Cardiovascular system: Regular rate and rhythm, no murmurs. Gastrointestinal system: Soft, nontender, nondistended Central nervous system: Alert and oriented moving all extremities Extremities: No lower extremity edema Skin: Over left posterior leg and popliteal fossa noted to have large irregular area of erythema that is blanchable, and for also noted to have several small ulcers that are open and weeping purulent fluid Psychiatry: Judgement and insight appear normal. Mood & affect appropriate.     Data Reviewed: I have personally reviewed following labs and imaging studies  CBC: Recent Labs  Lab 11/09/17 1337  WBC 9.9  NEUTROABS 6.7  HGB 13.3  HCT 41.4  MCV 90.4  PLT 258   Basic Metabolic Panel: Recent Labs  Lab 11/09/17 1337  NA 142  K 3.8  CL 106  CO2 24  GLUCOSE 102*  BUN 19  CREATININE 1.06  CALCIUM 9.0   GFR: Estimated Creatinine Clearance: 90.1 mL/min (by C-G formula based on SCr of 1.06 mg/dL). Liver Function Tests: No results for input(s): AST, ALT, ALKPHOS, BILITOT, PROT, ALBUMIN in the last 168 hours. No results for input(s): LIPASE, AMYLASE in the last 168 hours. No results for input(s): AMMONIA in the last 168 hours. Coagulation Profile: No results for input(s): INR, PROTIME in the last 168 hours. Cardiac Enzymes: No results for input(s): CKTOTAL, CKMB, CKMBINDEX, TROPONINI in the last 168 hours. BNP (last 3 results) No results for input(s): PROBNP in the last 8760 hours. HbA1C:  No results for input(s): HGBA1C in the last 72 hours. CBG: No results for input(s): GLUCAP in the last 168 hours. Lipid Profile: No results for input(s): CHOL, HDL, LDLCALC, TRIG, CHOLHDL, LDLDIRECT in the last 72 hours. Thyroid Function Tests: No results for input(s): TSH, T4TOTAL, FREET4, T3FREE, THYROIDAB in the last 72 hours. Anemia Panel: No results for input(s): VITAMINB12, FOLATE,  FERRITIN, TIBC, IRON, RETICCTPCT in the last 72 hours. Sepsis Labs: Recent Labs  Lab 11/09/17 1418  LATICACIDVEN 1.29    No results found for this or any previous visit (from the past 240 hour(s)).       Radiology Studies: Ct Extremity Lower Left W Contrast  Result Date: 11/09/2017 CLINICAL DATA:  Pain, redness and swelling of the left calf for 5 days. Question abscess. EXAM: CT OF THE LOWER LEFT EXTREMITY WITH CONTRAST TECHNIQUE: Multidetector CT imaging of the lower left extremity was performed according to the standard protocol following intravenous contrast administration. COMPARISON:  None. CONTRAST:  100 ml ISOVUE-300 IOPAMIDOL (ISOVUE-300) INJECTION 61% FINDINGS: Bones/Joint/Cartilage No fracture or dislocation. No bony destructive change or periosteal reaction. No avascular necrosis of the femoral head. Joint spaces are preserved. Ligaments Suboptimally assessed by CT. Muscles and Tendons No gas is identified within muscle or tracking along fascial planes. No intramuscular fluid collection is seen. Soft tissues Stranding in subcutaneous fat extends from approximately the mid thigh to the ankle and is worst on the lateral side. No rim enhancing fluid collection is identified. No radiopaque foreign body or soft tissue gas is seen. There is no knee or tibiotalar joint effusion. IMPRESSION: Stranding in subcutaneous fat of the left leg from the mid thigh to the ankle is worst laterally and most consistent with cellulitis. No abscess is identified. Negative for CT evidence of fasciitis, osteomyelitis or septic joint. Electronically Signed   By: Drusilla Kanner M.D.   On: 11/09/2017 16:01        Scheduled Meds: . enoxaparin (LOVENOX) injection  40 mg Subcutaneous Q24H  . gabapentin  400 mg Oral TID   Continuous Infusions: . cefTRIAXone (ROCEPHIN)  IV 2 g (11/10/17 1200)  . vancomycin Stopped (11/10/17 0300)     LOS: 0 days    Time spent: 15    Delaine Lame, MD [facility]  If 7PM-7AM, please contact night-coverage [email or web address] Password [facility] 11/10/2017, 12:42 PM

## 2017-11-10 NOTE — Progress Notes (Signed)
LLE venous duplex prelim: negative for DVT. Danny Kirby, RDMS, RVT  

## 2017-11-11 DIAGNOSIS — L03116 Cellulitis of left lower limb: Principal | ICD-10-CM

## 2017-11-11 MED ORDER — GABAPENTIN 300 MG PO CAPS: 300.0000 mg | ORAL_CAPSULE | Freq: Three times a day (TID) | ORAL | 2 refills | Status: AC

## 2017-11-11 MED ORDER — CEFDINIR 300 MG PO CAPS: 300.0000 mg | ORAL_CAPSULE | Freq: Two times a day (BID) | ORAL | 0 refills | Status: AC

## 2017-11-11 MED ORDER — LACTINEX PO CHEW: 1.0000 | CHEWABLE_TABLET | Freq: Three times a day (TID) | ORAL | 0 refills | Status: AC

## 2017-11-11 MED ORDER — IBUPROFEN 200 MG PO TABS: 400.0000 mg | ORAL_TABLET | Freq: Four times a day (QID) | ORAL | 2 refills | Status: AC | PRN

## 2017-11-11 MED ORDER — DOXYCYCLINE HYCLATE 100 MG PO TABS: 100.0000 mg | ORAL_TABLET | Freq: Two times a day (BID) | ORAL | 0 refills | Status: AC

## 2017-11-11 NOTE — Progress Notes (Signed)
Went over d/c instructions with patient.  He verbalized understanding.  Left hospital with wife via w/c.   Levora AngelHannah V Keyry Iracheta, RN

## 2017-11-11 NOTE — Discharge Summary (Signed)
Danny Kirby, is a 52 y.o. male  DOB 07/06/1966  MRN [id number].  Admission date:  11/09/2017  Admitting Physician  Kathlen ModyVijaya Akula, MD  Discharge Date:  11/11/2017   Primary MD  Patient, No Pcp Per  Recommendations for primary care physician for things to follow:   1) take medications as prescribed 2) follow-up with primary care doctor within a week to recheck left lower extremity cellulitis and wound 3) call if diarrhea while on antibiotics 4) use probiotic/lactobacillus as advised to prevent diarrhea and other complications of antibiotic use   Admission Diagnosis  leg infection   Discharge Diagnosis  leg infection    Active Problems:   Cellulitis      History reviewed. No pertinent past medical history.  Past Surgical History:  Procedure Laterality Date  . ANTERIOR CRUCIATE LIGAMENT REPAIR    . MEDIAL COLLATERAL LIGAMENT AND LATERAL COLLATERAL LIGAMENT REPAIR, KNEE    . NASAL SEPTUM SURGERY       HPI  from the history and physical done on the day of admission:    Chief Complaint: worsening left leg swelling, tenderness and redness.   HPI: Danny Kirby is a 52 y.o. male with medical history significant of cervical radiculopathy, spinal stenosis at the cervical area, comes in for worsening redness, swelling and pain of the left leg since thursday. It started as a small boil and has been worsening since then.  Pr reports he was on two different antibiotics, clindamycin and doxycycline but his redness, pain and swelling continues to worsen. Pt was seen at PCP office today and was referred to ED, for further evaluation. On arrival to ED, he was afebrile, with normal vitals. He underwent CT of the left leg showing Stranding in subcutaneous fat of the left leg from the mid thigh to the ankle is worst laterally and most consistent with cellulitis. No abscess is identified. Negative for CT evidence of  fasciitis, osteomyelitis or septic joint. He also reports subjective fevers and chills. No other complaints.  He was referred to med service for admission for failed outpatient therapy.     Hospital Course:    #) Left leg cellulitis: Overall much improved,  CT scan shows no evidence of deeper infection or abscess, lower extremity venous Dopplers without DVT, no fevers, no leukocytosis, prior to admission patient took gentamicin for less than 2 days, and he took only 2 doses of doxycycline.  Patient was treated with IV ceftriaxone and vancomycin started 11/09/2017 thru 11/11/17, overall much improved left lower extremity cellulitis, okay to discharge home on p.o. doxycycline and Omnicef for MRSA and strep coverage respectively   #) Cervical neuropathy: -Continue gabapentin 300 mg 3 times daily   Discharge Condition: stable  Follow UP- PCP in 1 week   Diet and Activity recommendation:  As advised  Discharge Instructions    Discharge Instructions    Call MD for:  difficulty breathing, headache or visual disturbances   Complete by:  As directed    Call MD for:  persistant dizziness or light-headedness   Complete by:  As directed    Call MD for:  persistant nausea and vomiting   Complete by:  As directed    Call MD for:  redness, tenderness, or signs of infection (pain, swelling, redness, odor or green/yellow discharge around incision site)   Complete by:  As directed    Call MD for:  temperature >100.4   Complete by:  As directed    Diet general   Complete by:  As directed    Discharge instructions   Complete by:  As directed    1) take medications as prescribed 2) follow-up with primary care doctor within a week to recheck left lower extremity cellulitis and wound 3) call if diarrhea while on antibiotics 4) use probiotic/lactobacillus as advised to prevent diarrhea and other complications of antibiotic use   Increase activity slowly   Complete by:  As directed         Discharge Medications     Allergies as of 11/11/2017   No Known Allergies     Medication List    STOP taking these medications   clindamycin 300 MG capsule Commonly known as:  CLEOCIN   diazepam 5 MG tablet Commonly known as:  VALIUM   doxycycline 100 MG capsule Commonly known as:  VIBRAMYCIN Replaced by:  doxycycline 100 MG tablet     TAKE these medications   cefdinir 300 MG capsule Commonly known as:  OMNICEF Take 1 capsule (300 mg total) by mouth 2 (two) times daily.   doxycycline 100 MG tablet Commonly known as:  VIBRA-TABS Take 1 tablet (100 mg total) by mouth 2 (two) times daily. Replaces:  doxycycline 100 MG capsule   gabapentin 300 MG capsule Commonly known as:  NEURONTIN Take 1 capsule (300 mg total) by mouth 3 (three) times daily. What changed:    medication strength  how much to take   ibuprofen 200 MG tablet Commonly known as:  MOTRIN IB Take 2 tablets (400 mg total) by mouth every 6 (six) hours as needed for fever or moderate pain. With FOOD What changed:    medication strength  how much to take  reasons to take this  additional instructions   lactobacillus acidophilus & bulgar chewable tablet Chew 1 tablet by mouth 3 (three) times daily with meals.       Major procedures and Radiology Reports - PLEASE review detailed and final reports for all details, in brief -   Xr C-arm No Report  Result Date: 10/18/2017 Please see Notes or Procedures tab for imaging impression.  Ct Extremity Lower Left W Contrast  Result Date: 11/09/2017 CLINICAL DATA:  Pain, redness and swelling of the left calf for 5 days. Question abscess. EXAM: CT OF THE LOWER LEFT EXTREMITY WITH CONTRAST TECHNIQUE: Multidetector CT imaging of the lower left extremity was performed according to the standard protocol following intravenous contrast administration. COMPARISON:  None. CONTRAST:  100 ml ISOVUE-300 IOPAMIDOL (ISOVUE-300) INJECTION 61% FINDINGS: Bones/Joint/Cartilage No  fracture or dislocation. No bony destructive change or periosteal reaction. No avascular necrosis of the femoral head. Joint spaces are preserved. Ligaments Suboptimally assessed by CT. Muscles and Tendons No gas is identified within muscle or tracking along fascial planes. No intramuscular fluid collection is seen. Soft tissues Stranding in subcutaneous fat extends from approximately the mid thigh to the ankle and is worst on the lateral side. No rim enhancing fluid collection is identified. No radiopaque foreign body or soft tissue gas is seen. There  is no knee or tibiotalar joint effusion. IMPRESSION: Stranding in subcutaneous fat of the left leg from the mid thigh to the ankle is worst laterally and most consistent with cellulitis. No abscess is identified. Negative for CT evidence of fasciitis, osteomyelitis or septic joint. Electronically Signed   By: Drusilla Kanner M.D.   On: 11/09/2017 16:01    Micro Results   Recent Results (from the past 240 hour(s))  Culture, blood (Routine X 2) w Reflex to ID Panel     Status: None (Preliminary result)   Collection Time: 11/09/17  2:00 PM  Result Value Ref Range Status   Specimen Description   Final    BLOOD RIGHT FOREARM Performed at [facility], [address], [place], [place] [address]    Special Requests   Final    BOTTLES DRAWN AEROBIC AND ANAEROBIC Blood Culture adequate volume Performed at [facility], [address] [place], [place] [address]    Culture   Final    NO GROWTH 2 DAYS Performed at [facility], [address] [place], [place] [address]    Report Status PENDING  Incomplete  Culture, blood (Routine X 2) w Reflex to ID Panel     Status: None (Preliminary result)   Collection Time: 11/09/17  2:30 PM  Result Value Ref Range Status   Specimen Description   Final    BLOOD LEFT WRIST Performed at [facility], [address], [place], [place] [address]     Special Requests   Final    BOTTLES DRAWN AEROBIC AND ANAEROBIC Blood Culture adequate volume Performed at [facility], [address], [place], [place] [address]    Culture   Final    NO GROWTH 2 DAYS Performed at [facility], [address] [place], [place] [address]    Report Status PENDING  Incomplete       Today   Subjective    Danny Kirby today has no new complaints, left lower extremity swelling, warmth, redness and tenderness has improved significantly, no fevers, eating and drinking well          Patient has been seen and examined prior to discharge   Objective   Blood pressure 138/82, pulse 61, temperature 97.9 F (36.6 C), temperature source Oral, resp. rate 12, height 5\' 8"  (1.727 m), weight 90.7 kg (200 lb), SpO2 99 %.   Intake/Output Summary (Last 24 hours) at 11/11/2017 1429 Last data filed at 11/11/2017 0900 Gross per 24 hour  Intake 340 ml  Output -  Net 340 ml    Exam Gen:- Awake Alert,  In no apparent distress  HEENT:- [place].AT, No sclera icterus Neck-Supple Neck,No JVD,.  Lungs-  CTAB , no wheezing CV- S1, S2 normal Abd-  +ve B.Sounds, Abd Soft, No tenderness,    Extremity/Skin:-Left lower extremity especially in the posterior calf and popliteal areas which much improved erythema, much improved swelling, much improved tenderness, patient has a couple of areas that are draining serous fluid.  Does not appear to be purulent.  No streaking no significant warmth, no fluctuance Psych-affect is appropriate, oriented x3 Neuro-no new focal deficits, no tremors   Data Review   CBC w Diff:  Lab Results  Component Value Date   WBC 9.9 11/09/2017   HGB 13.3 11/09/2017   HCT 41.4 11/09/2017   PLT 258 11/09/2017   LYMPHOPCT 23 11/09/2017   MONOPCT 8 11/09/2017   EOSPCT 3 11/09/2017  BASOPCT 0 11/09/2017    CMP:  Lab Results  Component Value Date   NA 142 11/09/2017   K 3.8 11/09/2017   CL 106 11/09/2017   CO2 24  11/09/2017   BUN 19 11/09/2017   CREATININE 1.06 11/09/2017  .   Total Discharge time is about 33 minutes  Shon Hale M.D on 11/11/2017 at 2:29 PM  [facility]   Office  [phone]  Voice Recognition Reubin [employer] dictation system was used to create this note, attempts have been made to correct errors. Please contact the author with questions and/or clarifications.

## 2017-11-11 NOTE — Discharge Instructions (Signed)
1)Take medications as prescribed 2) Follow-up with primary care doctor within a week to recheck left lower extremity cellulitis and wound 3) Call if Diarrhea while on antibiotics 4) Use Probiotic/Lactobacillus as advised to prevent diarrhea and other complications of antibiotic use

## 2017-11-14 LAB — CULTURE, BLOOD (ROUTINE X 2)
CULTURE: NO GROWTH
CULTURE: NO GROWTH
SPECIAL REQUESTS: ADEQUATE
SPECIAL REQUESTS: ADEQUATE

## 2017-11-25 NOTE — Telephone Encounter (Signed)
Completed.

## 2018-01-24 ENCOUNTER — Telehealth (INDEPENDENT_AMBULATORY_CARE_PROVIDER_SITE_OTHER): Payer: Self-pay | Admitting: Physical Medicine and Rehabilitation

## 2018-01-24 NOTE — Telephone Encounter (Signed)
Yes ok 

## 2018-01-25 NOTE — Telephone Encounter (Signed)
Authorization Number:  [id number]  Auth Effective Date:  01/25/2018  Auth End Date:  04/25/2018

## 2018-01-25 NOTE — Telephone Encounter (Signed)
Patient reports close to 100% relief from injection on 2/25. Needs auth for repeat [id number].

## 2018-02-28 ENCOUNTER — Ambulatory Visit (INDEPENDENT_AMBULATORY_CARE_PROVIDER_SITE_OTHER): Payer: Managed Care, Other (non HMO) | Admitting: Physical Medicine and Rehabilitation

## 2018-02-28 ENCOUNTER — Ambulatory Visit (INDEPENDENT_AMBULATORY_CARE_PROVIDER_SITE_OTHER): Payer: Self-pay

## 2018-02-28 ENCOUNTER — Encounter

## 2018-02-28 ENCOUNTER — Encounter (INDEPENDENT_AMBULATORY_CARE_PROVIDER_SITE_OTHER): Payer: Self-pay | Admitting: Physical Medicine and Rehabilitation

## 2018-02-28 VITALS — BP 119/78 | HR 59

## 2018-02-28 DIAGNOSIS — M5412 Radiculopathy, cervical region: Secondary | ICD-10-CM | POA: Diagnosis not present

## 2018-02-28 MED ORDER — METHYLPREDNISOLONE ACETATE 80 MG/ML IJ SUSP
80.0000 mg | Freq: Once | INTRAMUSCULAR | Status: AC
  Administered 2018-02-28: 80 mg

## 2018-02-28 NOTE — Progress Notes (Signed)
.  Numeric Pain Rating Scale and Functional Assessment Average Pain 6   In the last MONTH (on 0-10 scale) has pain interfered with the following?  1. General activity like being  able to carry out your everyday physical activities such as walking, climbing stairs, carrying groceries, or moving a chair?  Rating(5)   +Driver, -BT, -Dye Allergies.  

## 2018-02-28 NOTE — Patient Instructions (Signed)

## 2018-03-01 NOTE — Procedures (Signed)
Cervical Epidural Steroid Injection - Interlaminar Approach with Fluoroscopic Guidance  Patient: Danny Kirby      Date of Birth: 11/23/1965 MRN: [id number] PCP: Patient, No Pcp Per      Visit Date: 02/28/2018   Universal Protocol:    Date/Time: 07/09/196:23 AM  Consent Given By: the patient  Position: PRONE  Additional Comments: Vital signs were monitored before and after the procedure. Patient was prepped and draped in the usual sterile fashion. The correct patient, procedure, and site was verified.   Injection Procedure Details:  Procedure Site One Meds Administered:  Meds ordered this encounter  Medications  . methylPREDNISolone acetate (DEPO-MEDROL) injection 80 mg     Laterality: Right  Location/Site: C7-T1  Needle size: 20 G  Needle type: Touhy  Needle Placement: Paramedian epidural space  Findings:  -Comments: Excellent flow of contrast into the epidural space.  Procedure Details: Using a paramedian approach from the side mentioned above, the region overlying the inferior lamina was localized under fluoroscopic visualization and the soft tissues overlying this structure were infiltrated with 4 ml. of 1% Lidocaine without Epinephrine. A # 20 gauge, Tuohy needle was inserted into the epidural space using a paramedian approach.  The epidural space was localized using loss of resistance along with lateral and contralateral oblique bi-planar fluoroscopic views.  After negative aspirate for air, blood, and CSF, a 2 ml. volume of Isovue-250 was injected into the epidural space and the flow of contrast was observed. Radiographs were obtained for documentation purposes.   The injectate was administered into the level noted above.  Additional Comments:  The patient tolerated the procedure well Dressing: Band-Aid    Post-procedure details: Patient was observed during the procedure. Post-procedure instructions were reviewed.  Patient left the clinic in stable  condition.

## 2018-03-01 NOTE — Progress Notes (Signed)
Danny Kirby - 52 y.o. male MRN [id number]  Date of birth: 07/14/1966  Office Visit Note: Visit Date: 02/28/2018 PCP: Patient, No Pcp Per Referred by: No ref. provider found  Subjective: Chief Complaint  Patient presents with  . Neck - Pain  . Right Shoulder - Pain  . Right Upper Arm - Pain  . Right Forearm - Numbness   HPI: [name] is a 52 year old right-hand-dominant gentleman who comes in today with his wife who provides some of the history.  We completed cervical epidural injection in February of this year with 100% relief for a couple of months.  This was diagnostic and therapeutic.  MRI imaging showing disc protrusions more left paracentral than right but with right radicular arm pain.  Patient has had no new trauma.  Since I have seen him though he has had an issue with a spider bite on his right lower limb with significant cellulitis and went through a course of antibiotics and just general stress increasing throughout the months.  Symptoms have returned.  We brought him in for planned repeat of the cervical epidural injection since it was beneficial for a couple of months.  He will return to see Dr. Berline Choughigby in follow-up depending on the length of time the injection helps.  Certainly consider regrouping with physical therapy for possible dry needling.  They have failed other conservative care [place] [facility] [place]  Acupuncture seem to help on the first visit but not the second visit.  Exam today is really nonfocal.   ROS Otherwise per HPI.  Assessment & Plan: Visit Diagnoses:  1. Cervical radiculopathy     Plan: No additional findings.   Meds & Orders:  Meds ordered this encounter  Medications  . methylPREDNISolone acetate (DEPO-MEDROL) injection 80 mg    Orders Placed This Encounter  Procedures  . XR C-ARM NO REPORT  . Epidural Steroid injection    Follow-up: Return if symptoms worsen or fail to improve.   Procedures: No procedures  performed  Cervical Epidural Steroid Injection - Interlaminar Approach with Fluoroscopic Guidance  Patient: Danny Kirby      Date of Birth: 08/08/1966 MRN: [id number] PCP: Patient, No Pcp Per      Visit Date: 02/28/2018   Universal Protocol:    Date/Time: 07/09/196:23 AM  Consent Given By: the patient  Position: PRONE  Additional Comments: Vital signs were monitored before and after the procedure. Patient was prepped and draped in the usual sterile fashion. The correct patient, procedure, and site was verified.   Injection Procedure Details:  Procedure Site One Meds Administered:  Meds ordered this encounter  Medications  . methylPREDNISolone acetate (DEPO-MEDROL) injection 80 mg     Laterality: Right  Location/Site: C7-T1  Needle size: 20 G  Needle type: Touhy  Needle Placement: Paramedian epidural space  Findings:  -Comments: Excellent flow of contrast into the epidural space.  Procedure Details: Using a paramedian approach from the side mentioned above, the region overlying the inferior lamina was localized under fluoroscopic visualization and the soft tissues overlying this structure were infiltrated with 4 ml. of 1% Lidocaine without Epinephrine. A # 20 gauge, Tuohy needle was inserted into the epidural space using a paramedian approach.  The epidural space was localized using loss of resistance along with lateral and contralateral oblique bi-planar fluoroscopic views.  After negative aspirate for air, blood, and CSF, a 2 ml. volume of Isovue-250 was injected into the epidural space and the flow of  contrast was observed. Radiographs were obtained for documentation purposes.   The injectate was administered into the level noted above.  Additional Comments:  The patient tolerated the procedure well Dressing: Band-Aid    Post-procedure details: Patient was observed during the procedure. Post-procedure instructions were reviewed.  Patient left the clinic in  stable condition.   Clinical History: MRI CERVICAL SPINE WITHOUT CONTRAST  TECHNIQUE: Multiplanar, multisequence MR imaging of the cervical spine was performed. No intravenous contrast was administered.  COMPARISON: Cervical spine radiographs 08/20/2017  MRI cervical spine 03/28/2010  FINDINGS: Alignment: Physiologic.  Vertebrae: No fracture, evidence of discitis, or bone lesion.  Cord: Normal signal and morphology.  Posterior Fossa, vertebral arteries, paraspinal tissues: Visualized posterior fossa is normal. Vertebral artery flow voids are preserved. No prevertebral soft tissue swelling.  Disc levels:  C1-C2: Normal.  C2-C3: Normal disc space and facets. No spinal canal or neuroforaminal stenosis.  C3-C4: Small left subarticular disc protrusion without stenosis, unchanged.  C4-C5: Unchanged small central protrusion without stenosis.  C5-C6: Medium-sized left subarticular disc protrusion, unchanged in size, effacing the ventral thecal sac and causing moderate spinal canal stenosis.  C6-C7: Medium-sized central disc protrusion, slightly increased. Mild spinal canal stenosis with indentation of the ventral spinal cord.  C7-T1: Normal disc space and facets. No spinal canal or neuroforaminal stenosis.  IMPRESSION: 1. C5-C6 unchanged left subarticular disc protrusion with moderate spinal canal stenosis and deformity of the spinal cord without signal change. 2. C6-C7 slightly increased size of central disc protrusion with mild spinal canal stenosis.   Electronically Signed By: Deatra Robinson M.D. On: 09/15/2017 19:50   He reports that he has never smoked. He has never used smokeless tobacco. No results for input(s): HGBA1C, LABURIC in the last 8760 hours.  Objective:  VS:  HT:    WT:   BMI:     BP:119/78  HR:(!) 59bpm  TEMP: ( )  RESP:  Physical Exam  Ortho Exam Imaging: Xr C-arm No Report  Result Date: 02/28/2018 Please see Notes tab for imaging  impression.   Past Medical/Family/Surgical/Social History: Medications & Allergies reviewed per EMR, new medications updated. Patient Active Problem List   Diagnosis Date Noted  . Cellulitis 11/09/2017  . Neck pain 08/20/2017   History reviewed. No pertinent past medical history. History reviewed. No pertinent family history. Past Surgical History:  Procedure Laterality Date  . ANTERIOR CRUCIATE LIGAMENT REPAIR    . MEDIAL COLLATERAL LIGAMENT AND LATERAL COLLATERAL LIGAMENT REPAIR, KNEE    . NASAL SEPTUM SURGERY     Social History   Occupational History  . Not on file  Tobacco Use  . Smoking status: Never Smoker  . Smokeless tobacco: Never Used  Substance and Sexual Activity  . Alcohol use: No    Alcohol/week: 0.0 oz  . Drug use: No  . Sexual activity: Yes

## 2018-03-14 ENCOUNTER — Telehealth (INDEPENDENT_AMBULATORY_CARE_PROVIDER_SITE_OTHER): Payer: Self-pay | Admitting: *Deleted

## 2018-03-14 ENCOUNTER — Other Ambulatory Visit (INDEPENDENT_AMBULATORY_CARE_PROVIDER_SITE_OTHER): Payer: Self-pay | Admitting: *Deleted

## 2018-03-14 DIAGNOSIS — M7918 Myalgia, other site: Secondary | ICD-10-CM

## 2018-03-14 DIAGNOSIS — M5412 Radiculopathy, cervical region: Secondary | ICD-10-CM

## 2018-03-14 NOTE — Telephone Encounter (Signed)
Called pt to advise on PT rx for pick up.

## 2018-03-14 NOTE — Telephone Encounter (Signed)
Can you start a script for me with his info on there

## 2020-01-05 IMAGING — CT CT EXTREM LOW W/ CM*L*
2 of 5 series · 10 of 46 positions shown, 11 images · IV contrast (agent unspecified)
Comparison: None.

CONTRAST:  100 ml TJYZ0M-6UU IOPAMIDOL (TJYZ0M-6UU) INJECTION 61%

CLINICAL DATA: Pain, redness and swelling of the left calf for 5
days. Question abscess.

EXAM:
CT OF THE LOWER LEFT EXTREMITY WITH CONTRAST
TECHNIQUE: Multidetector CT imaging of the lower left extremity was performed
according to the standard protocol following intravenous contrast
administration.

[Series 3: axial st · axial · 0.54mm/px · z∈[-1390,-629]mm · 7 of 629 slices shown, 8 images]
[im 61/629  soft-tissue]
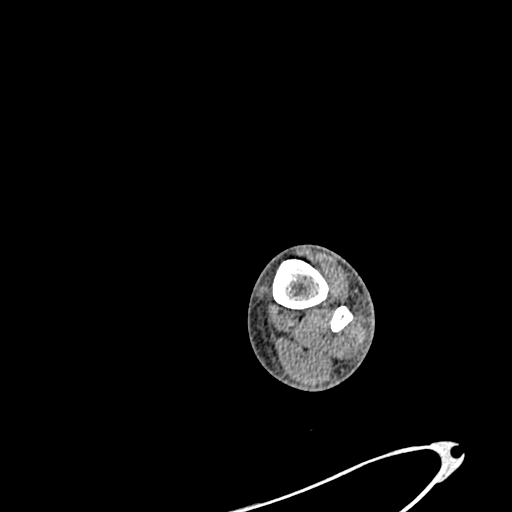
[im 61/629  bone]
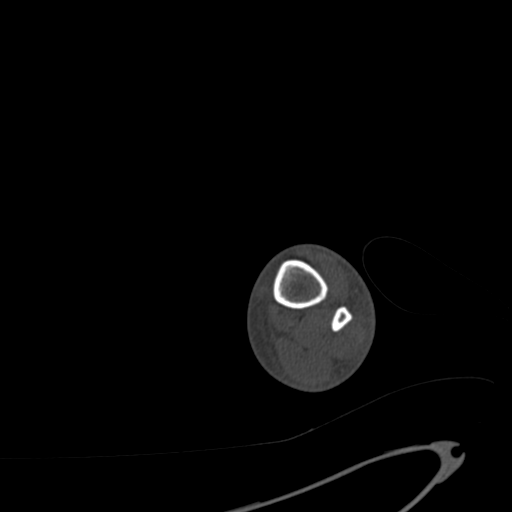
[im 142/629  soft-tissue]
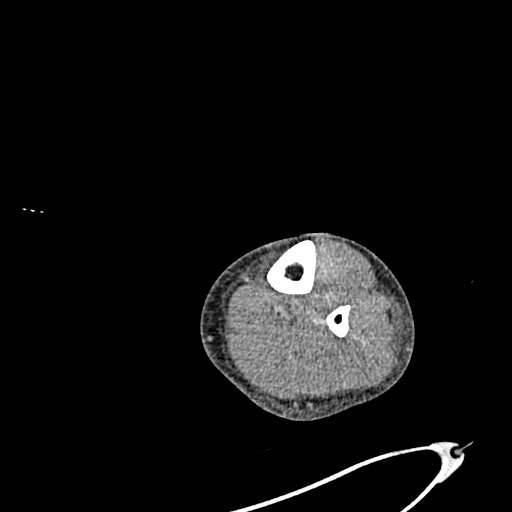
[im 223/629  soft-tissue]
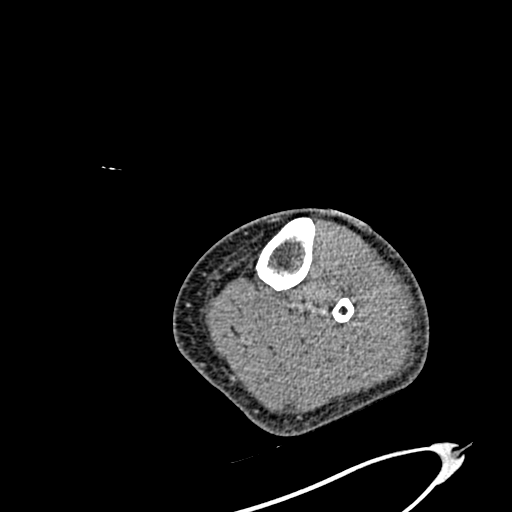
[im 325/629  soft-tissue]
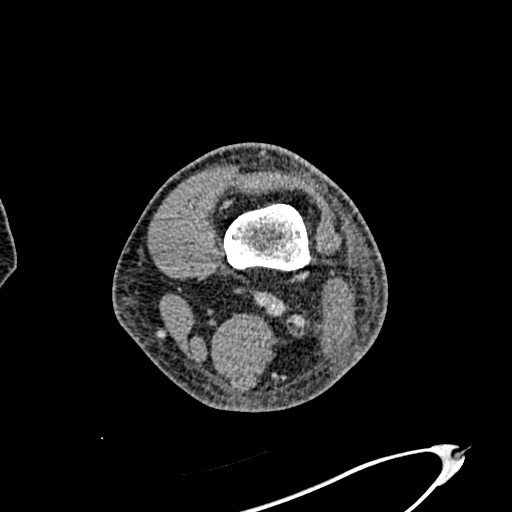
[im 406/629  soft-tissue]
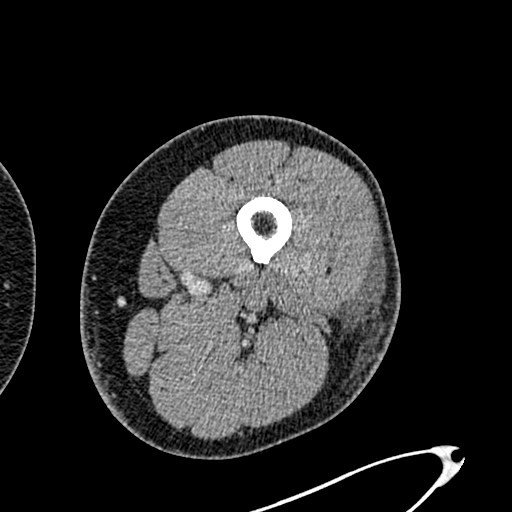
[im 487/629  soft-tissue]
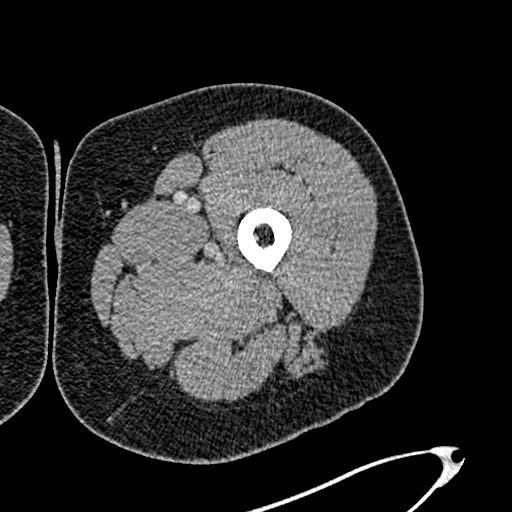
[im 568/629  soft-tissue]
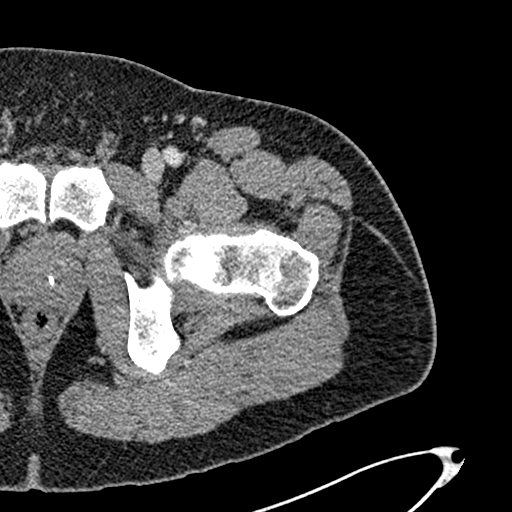

[Series 11: sagittal st · coronal · 0.49mm/px · 3 of 152 slices shown]
[im 38/152  soft-tissue]
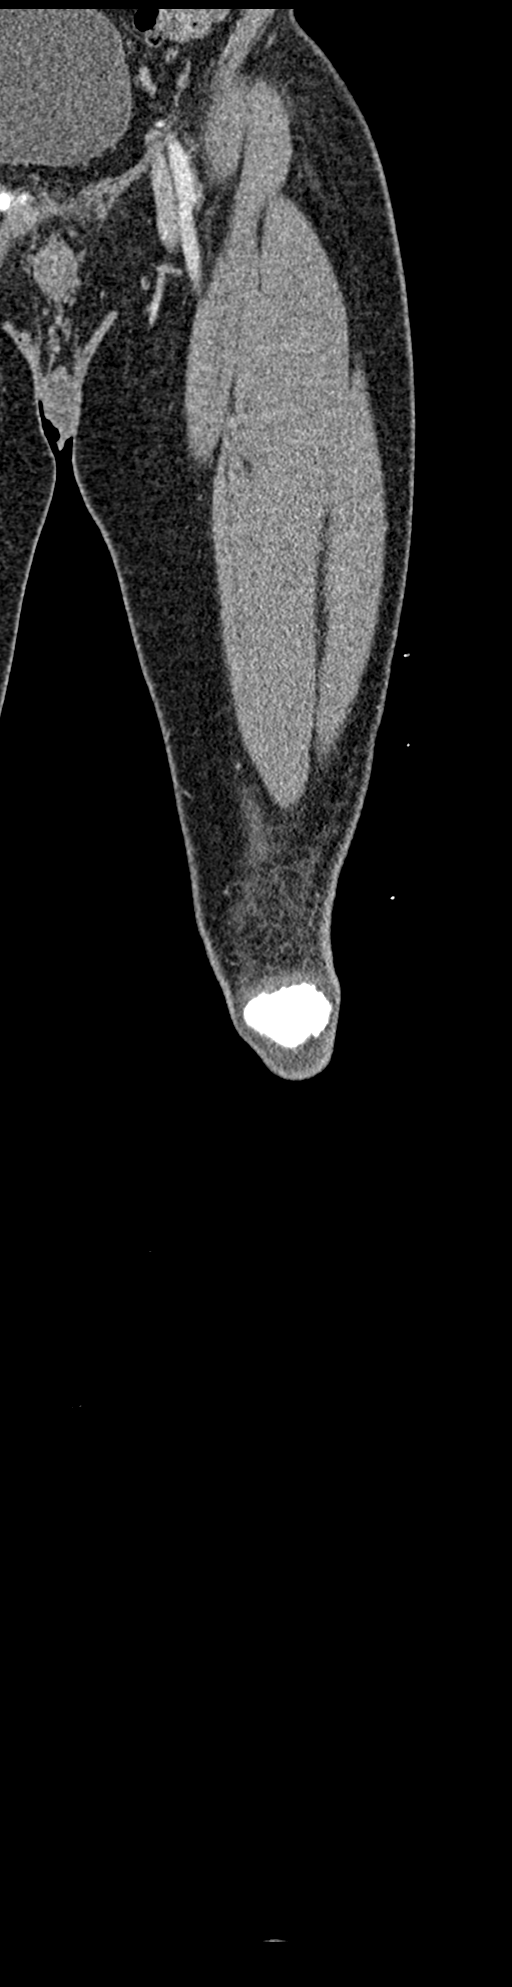
[im 76/152  soft-tissue]
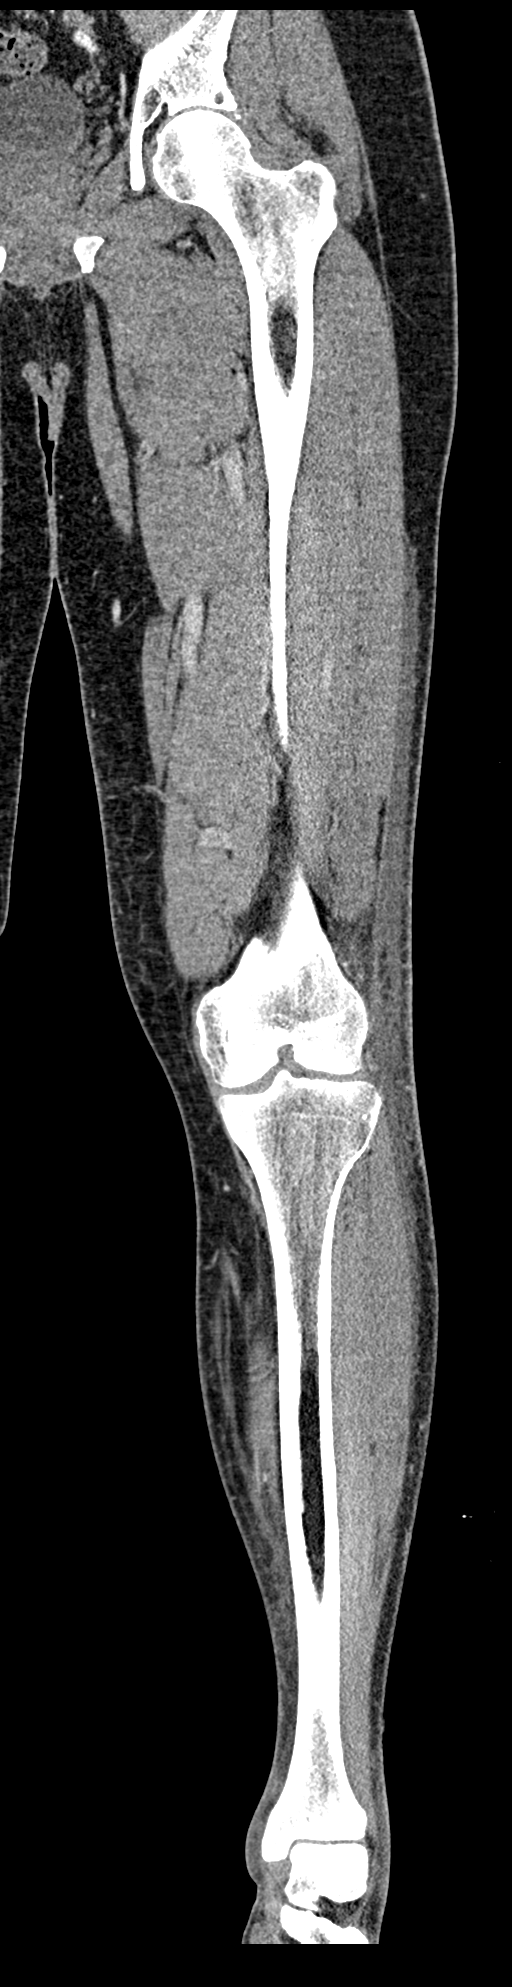
[im 114/152  soft-tissue]
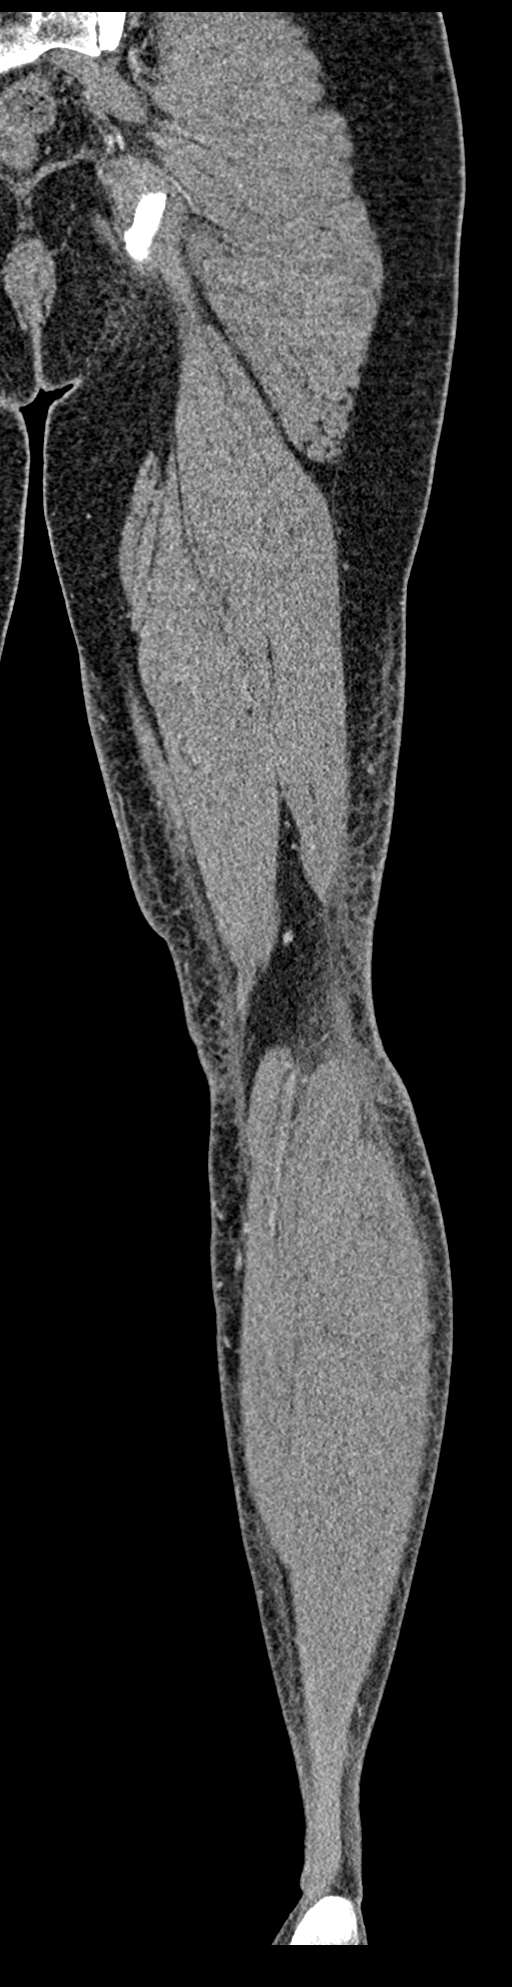

[10 of 46 positions shown; findings below may reference images not displayed]

FINDINGS: Bones/Joint/Cartilage

No fracture or dislocation. No bony destructive change or periosteal
reaction. No avascular necrosis of the femoral head. Joint spaces
are preserved.

Ligaments

Suboptimally assessed by CT.

Muscles and Tendons

No gas is identified within muscle or tracking along fascial planes.
No intramuscular fluid collection is seen.

Soft tissues

Stranding in subcutaneous fat extends from approximately the mid
thigh to the ankle and is worst on the lateral side. No rim
enhancing fluid collection is identified. No radiopaque foreign body
or soft tissue gas is seen. There is no knee or tibiotalar joint
effusion.
IMPRESSION: Stranding in subcutaneous fat of the left leg from the mid thigh to
the ankle is worst laterally and most consistent with cellulitis. No
abscess is identified. Negative for CT evidence of fasciitis,
osteomyelitis or septic joint.
